# Patient Record
Sex: Female | Born: 1990 | Hispanic: Yes | Marital: Single | Smoking: Never smoker
Health system: Southern US, Community
[De-identification: ages and names within clinical notes are randomized; demographics above are authoritative.]

## PROBLEM LIST (undated history)

## (undated) ENCOUNTER — Inpatient Hospital Stay (HOSPITAL_COMMUNITY): Payer: Self-pay

## (undated) DIAGNOSIS — N189 Chronic kidney disease, unspecified: Secondary | ICD-10-CM

## (undated) HISTORY — PX: NO PAST SURGERIES: SHX2092

---
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
## 2010-05-19 LAB — CBC
HCT: 37.5 % (ref 36.0–49.0)
Hemoglobin: 12.5 g/dL (ref 12.0–16.0)
MCHC: 33.3 g/dL (ref 31.0–37.0)
MCV: 89.3 fL (ref 78.0–98.0)
MCV: 89.7 fL (ref 78.0–98.0)
Platelets: 207 10*3/uL (ref 150–400)
Platelets: 222 K/uL (ref 150–400)
RBC: 4.2 MIL/uL (ref 3.80–5.70)
RDW: 13.7 % (ref 11.4–15.5)
RDW: 13.8 % (ref 11.4–15.5)
WBC: 19.3 10*3/uL — ABNORMAL HIGH (ref 4.5–13.5)
WBC: 8.5 K/uL (ref 4.5–13.5)

## 2010-06-17 NOTE — Discharge Summary (Signed)
NAMEKAMERA, DUBAS NO.:  [id number]   MEDICAL RECORD NO.:  [id number]          PATIENT TYPE:  INP   LOCATION:  9302                          FACILITY:  [facility]   PHYSICIAN:  Lesly Dukes, M.D. DATE OF BIRTH:  Jul 02, 1990   DATE OF ADMISSION:  11/07/2007  DATE OF DISCHARGE:  11/10/2007                               DISCHARGE SUMMARY   REASON FOR ADMISSION:  Ms. [name] Warner is a 20 year old gravida 1, para  0 who presented with complaints of fever, chills, dysuria, and body  aches.  On initial evaluation, she was found to have urinalysis that  showed numerous white blood cells and was leukocyte esterase positive.  She was admitted to the ward for treatment of acute pyelonephritis.   HOSPITAL COURSE:  The patient's initial temperature on evaluation at  [facility] Admissions Unit was 99.6.  She was admitted to the hospital,  started on IV fluids, and IV Rocephin 1 g twice daily as well as Tylenol  and Percocet as needed for pain.  She remained afebrile during the  hospitalization and was discharged on hospital day #4 in good condition.  She will be given an outpatient prescription for Keflex 500 mg 1 pill 4  times a day for 11 more days.  She has also given a prescription for  prenatal vitamins.  She was instructed to follow up with her primary  care [name] Warner Dr. Sharen Hones in [facility] next week for  follow up care.   RECOMMENDATION:  That after her she completes 2-week course of  antibiotics that she will be placed on suppressive therapy with Macrobid  1 pill daily for the duration of her pregnancy to prevent any further  urinary tract infections or pyelonephritis.   CONDITION ON DISCHARGE:  Good.   LABS:  As described in hospital course.  There is a urine culture that  is pending at this time.   DISCHARGE INSTRUCTIONS:  The patient was instructed to maintain good  fluid intake and she has no restrictions as far as activity or diet is  concerned.  She was instructed to follow up with Dr. Sharen Hones next week  and she was instructed on her antibiotic medications as well.  All her  questions were answered.  The patient voiced understanding of these  instructions.      Odie Sera, DO      Lesly Dukes, M.D.  Electronically Signed    MC/MEDQ  D:  11/10/2007  T:  11/10/2007  Job:  [id number]

## 2010-11-03 LAB — URINALYSIS, ROUTINE W REFLEX MICROSCOPIC
Bilirubin Urine: NEGATIVE
Ketones, ur: NEGATIVE
Nitrite: NEGATIVE
Protein, ur: NEGATIVE
Urobilinogen, UA: 0.2
pH: 7

## 2010-11-03 LAB — URINE MICROSCOPIC-ADD ON

## 2010-11-04 LAB — COMPREHENSIVE METABOLIC PANEL
ALT: 27
AST: 35
Albumin: 2.6 — ABNORMAL LOW
CO2: 21
Chloride: 99
Creatinine, Ser: 0.53
Sodium: 131 — ABNORMAL LOW
Total Bilirubin: 0.4

## 2010-11-04 LAB — URINALYSIS, ROUTINE W REFLEX MICROSCOPIC
Bilirubin Urine: NEGATIVE
Glucose, UA: NEGATIVE
Protein, ur: NEGATIVE
Specific Gravity, Urine: 1.01
Urobilinogen, UA: 0.2

## 2010-11-04 LAB — URINE MICROSCOPIC-ADD ON

## 2010-11-04 LAB — URINE CULTURE: Colony Count: >=100000

## 2010-11-04 LAB — RAPID URINE DRUG SCREEN, HOSP PERFORMED
Barbiturates: NOT DETECTED
Opiates: NOT DETECTED

## 2010-11-04 LAB — CBC
Platelets: 235
RBC: 3.7 — ABNORMAL LOW
WBC: 9.6

## 2010-11-04 LAB — GLUCOSE, CAPILLARY: Glucose-Capillary: 116 — ABNORMAL HIGH

## 2012-06-13 ENCOUNTER — Inpatient Hospital Stay (HOSPITAL_COMMUNITY): Payer: Self-pay

## 2012-06-13 ENCOUNTER — Inpatient Hospital Stay (HOSPITAL_COMMUNITY)
Admission: AD | Admit: 2012-06-13 | Discharge: 2012-06-14 | Disposition: A | Discharge status: Home / Self Care | Payer: MEDICAID | Source: Ambulatory Visit | Attending: Obstetrics & Gynecology | Admitting: Obstetrics & Gynecology

## 2012-06-13 ENCOUNTER — Encounter (HOSPITAL_COMMUNITY): Payer: Self-pay | Admitting: *Deleted

## 2012-06-13 ENCOUNTER — Other Ambulatory Visit (HOSPITAL_COMMUNITY): Payer: Self-pay | Admitting: Nurse Practitioner

## 2012-06-13 DIAGNOSIS — R109 Unspecified abdominal pain: Secondary | ICD-10-CM | POA: Insufficient documentation

## 2012-06-13 DIAGNOSIS — O039 Complete or unspecified spontaneous abortion without complication: Secondary | ICD-10-CM

## 2012-06-13 DIAGNOSIS — O3680X0 Pregnancy with inconclusive fetal viability, not applicable or unspecified: Secondary | ICD-10-CM

## 2012-06-13 HISTORY — DX: Chronic kidney disease, unspecified: N18.9

## 2012-06-13 LAB — POCT PREGNANCY, URINE: Preg Test, Ur: POSITIVE — AB

## 2012-06-13 NOTE — MAU Note (Signed)
Pt at the office today, had a pap smear, had a small amt of bleeding then an hour later bleeding became heavier and cramping and lower back pain.

## 2012-06-13 NOTE — MAU Provider Note (Signed)
First Provider Initiated Contact with Patient 06/13/12 2228      Chief Complaint:  Abdominal Cramping bleeding  [name] Warner is  22 y.o. G2P1 at [redacted]w[redacted]d presents complaining of Abdominal Cramping Pt states [facility] a pap smear.  She began spotting afterwards, and the bleeding has since increased, along with abdominal cramping.  Her EDC is based loosely on a + UPT, as her LMP was last July.  No u/s yet this pregnancy (scheduled for this Friday).  Obstetrical/Gynecological History: Menstrual History: OB History   Grav Para Term Preterm Abortions TAB SAB Ect Mult Living   2 1        1        No LMP recorded. Patient is pregnant.     Past Medical History: Past Medical History  Diagnosis Date  . Chronic kidney disease     Past Surgical History: History reviewed. No pertinent past surgical history.  Family History: Family History  Problem Relation Age of Onset  . Asthma Mother   . Cancer Father   . Cancer Paternal Aunt   . Cancer Paternal Uncle     Social History: History  Substance Use Topics  . Smoking status: Never Smoker   . Smokeless tobacco: Never Used  . Alcohol Use: No    Allergies: No Known Allergies  Meds:  Prescriptions prior to admission  Medication Sig Dispense Refill  . Prenatal Vitamins (CALNA) TABS Take by mouth.          Review of Systems   Constitutional: Negative for fever and chills Eyes: Negative for visual disturbances Respiratory: Negative for shortness of breath, dyspnea Cardiovascular: Negative for chest pain or palpitations  Gastrointestinal: Negative for vomiting, diarrhea and constipation.  + for abdominal cramping Genitourinary: Negative for dysuria and urgency.  + for vaginal bleeding Musculoskeletal: Negative for back pain, joint pain, myalgias  Neurological: Negative for dizziness and headaches    Physical Exam  Blood pressure 116/73, pulse 80, temperature 97 F (36.1  C), temperature source Oral, resp. rate 16, height 5\' 3"  (1.6 m), weight 72.213 kg (159 lb 3.2 oz). GENERAL: Well-developed, well-nourished female in no acute distress.  LUNGS: Clear to auscultation bilaterally.  HEART: Regular rate and rhythm. ABDOMEN: Soft, nontender, nondistended, gravid.  VAGINAL:  SSE:  Moderate amount dark to bright red blood coming from cervical os.  Cervix closed per digital exam EXTREMITIES: Nontender, no edema, 2+ distal pulses.   Labs: Results for orders placed during the hospital encounter of 06/13/12 (from the past 24 hour(s))  POCT PREGNANCY, URINE   Collection Time    06/13/12  8:59 PM      Result Value Range   Preg Test, Ur POSITIVE (*) NEGATIVE  HCG, QUANTITATIVE, PREGNANCY   Collection Time    06/13/12  9:35 PM      Result Value Range   hCG, Beta Chain, Quant, S 4447 (*) <5 mIU/mL    Blood Type  B+ Imaging Studies:  No results found.  Assessment: [name] Warner is  22 y.o. G2P1 at [redacted]w[redacted]d presents with unknown GA, + bleeding from cervical os.  Plan: Ultrasound tonight.  Care turned over to [place] Either, [place]  CRESENZO-DISHMAN,FRANCES 5/12/201411:05 PM  2355 - Care assumed from Cathie Beams, CNM Patient waiting for [place].   US Ob Comp Less 14 Wks  06/14/2012  Bleeding, pain  Elongatedand low in positionNot visualized Embryo:  Not visualized.  Ovaries not visualized.  Intrauterine gestational sac measures 22  mm, is low in position, and without yolk sac or embryo. Concerning for nonviable pregnancy. Recommend clinical/ serial quantitative beta HCG correlation and short-term sonographic follow-up.   Original Report Authenticated By: Jearld Lesch, M.D.    US Ob Transvaginal  06/14/2012  Bleeding, pain  Elongatedand low in positionNot visualized Embryo:  Not visualized.  Ovaries not visualized.  Intrauterine gestational sac measures 22 mm, is low in position, and without yolk sac or embryo. Concerning for nonviable pregnancy. Recommend  clinical/ serial quantitative beta HCG correlation and short-term sonographic follow-up.   Original Report Authenticated By: Jearld Lesch, M.D.    MDM Patient returned from [place] in severe pain. She feels as though bleeding has increased. I was able to remove a moderate amount of blood with 2 small clots from the vagina. This did not appear to contain much tissue. 1 mg IM Dilaudid given. Patient reports improvement in pain when reassessed about 20 minutes later. The patient then developed some nausea. 4 mg ODT Zofran given.  Discussed at length with patient options of expectant management vs. Cytotec with interpreter present. She would prefer to use cytotec today. Cytotec placed by RN in MAU.   A: SAB  P: Discharge home Rx for Percocet, Phenergan and Ibuprofen given Bleeding precautions discussed Patient will follow-up in GYN clinic in ~ 2 weeks Patient may return to MAU as needed or if her condition were to change or worsen  Freddi Starr, PA-C 06/14/2012 12:15 AM

## 2012-06-14 ENCOUNTER — Encounter (HOSPITAL_COMMUNITY): Payer: Self-pay

## 2012-06-14 ENCOUNTER — Inpatient Hospital Stay (HOSPITAL_COMMUNITY): Payer: Self-pay

## 2012-06-14 ENCOUNTER — Encounter (HOSPITAL_COMMUNITY): Payer: Self-pay | Admitting: *Deleted

## 2012-06-14 ENCOUNTER — Inpatient Hospital Stay (HOSPITAL_COMMUNITY)
Admission: AD | Admit: 2012-06-14 | Discharge: 2012-06-14 | Disposition: A | Discharge status: Home / Self Care | Payer: MEDICAID | Source: Ambulatory Visit | Attending: Obstetrics & Gynecology | Admitting: Obstetrics & Gynecology

## 2012-06-14 DIAGNOSIS — O034 Incomplete spontaneous abortion without complication: Secondary | ICD-10-CM

## 2012-06-14 DIAGNOSIS — O039 Complete or unspecified spontaneous abortion without complication: Secondary | ICD-10-CM | POA: Insufficient documentation

## 2012-06-14 LAB — CBC
HCT: 34.9 % — ABNORMAL LOW (ref 36.0–46.0)
Hemoglobin: 12.7 g/dL (ref 12.0–15.0)
Platelets: 244 10*3/uL (ref 150–400)
RBC: 4.84 MIL/uL (ref 3.87–5.11)
RBC: 4.19 MIL/uL (ref 3.87–5.11)
RDW: 12 % (ref 11.5–15.5)
WBC: 10.1 10*3/uL (ref 4.0–10.5)
WBC: 14 10*3/uL — ABNORMAL HIGH (ref 4.0–10.5)

## 2012-06-14 LAB — HCG, QUANTITATIVE, PREGNANCY: hCG, Beta Chain, Quant, S: 2694 m[IU]/mL — ABNORMAL HIGH (ref <5)

## 2012-06-14 MED ORDER — ONDANSETRON HCL 4 MG/2ML IJ SOLN
4.0000 mg | Freq: Once | INTRAMUSCULAR | Status: AC
  Administered 2012-06-14: 4 mg via INTRAVENOUS
  Filled 2012-06-14: qty 2

## 2012-06-14 MED ORDER — HYDROMORPHONE HCL PF 1 MG/ML IJ SOLN
1.0000 mg | Freq: Once | INTRAMUSCULAR | Status: AC
  Administered 2012-06-14: 1 mg via INTRAMUSCULAR
  Filled 2012-06-14: qty 1

## 2012-06-14 MED ORDER — KETOROLAC TROMETHAMINE 30 MG/ML IJ SOLN
30.0000 mg | Freq: Once | INTRAMUSCULAR | Status: AC
  Administered 2012-06-14: 30 mg via INTRAVENOUS
  Filled 2012-06-14: qty 1

## 2012-06-14 MED ORDER — ONDANSETRON 8 MG PO TBDP
8.0000 mg | ORAL_TABLET | Freq: Once | ORAL | Status: AC
  Administered 2012-06-14: 8 mg via ORAL
  Filled 2012-06-14: qty 1

## 2012-06-14 MED ORDER — MISOPROSTOL 200 MCG PO TABS
800.0000 ug | ORAL_TABLET | Freq: Once | ORAL | Status: AC
  Administered 2012-06-14: 800 ug via VAGINAL
  Filled 2012-06-14: qty 4

## 2012-06-14 MED ORDER — IBUPROFEN 600 MG PO TABS: 600.0000 mg | ORAL_TABLET | Freq: Four times a day (QID) | ORAL | Status: DC | PRN

## 2012-06-14 MED ORDER — LACTATED RINGERS IV BOLUS (SEPSIS)
1000.0000 mL | Freq: Once | INTRAVENOUS | Status: AC
  Administered 2012-06-14: 1000 mL via INTRAVENOUS

## 2012-06-14 MED ORDER — DEXTROSE 5 % IN LACTATED RINGERS IV BOLUS
1000.0000 mL | Freq: Once | INTRAVENOUS | Status: AC
  Administered 2012-06-14: 1000 mL via INTRAVENOUS

## 2012-06-14 MED ORDER — PROMETHAZINE HCL 25 MG PO TABS: 25.0000 mg | ORAL_TABLET | Freq: Four times a day (QID) | ORAL | Status: DC | PRN

## 2012-06-14 MED ORDER — OXYCODONE-ACETAMINOPHEN 5-325 MG PO TABS: 2.0000 | ORAL_TABLET | ORAL | Status: DC | PRN

## 2012-06-14 NOTE — MAU Note (Signed)
Patient is brought straight into room 2 with heavy vaginal bleeding. The medium sanitary pad that she placed at 8am was completely saturated. New blue pad given. Patient c/o mild cramping.

## 2012-06-14 NOTE — MAU Provider Note (Signed)
History     CSN: [id number]  Arrival date and time: 06/14/12 0910   None     Chief Complaint  Patient presents with  . Vaginal Bleeding  . Abdominal Cramping   HPI  Pt presents with heavy bleeding in pregnancy after being seen on 5/12 with threatened ab/anembryonic pregnancy- Her Beta HCG was 4447 at that time and ultrasound showed IUGS 22mm low without YS or embryo. Pt states that at 8 am her bleeding became very heavy saturating a blue pad and having cramping.  Pt passed some large clots.  Pt has not eaten anything today- has had some water early this morning and has not taken anything for the pain. Pt's Blood Type is  Pos.   Past Medical History  Diagnosis Date  . Chronic kidney disease     No past surgical history on file.  Family History  Problem Relation Age of Onset  . Asthma Mother   . Cancer Father   . Cancer Paternal Aunt   . Cancer Paternal Uncle     History  Substance Use Topics  . Smoking status: Never Smoker   . Smokeless tobacco: Never Used  . Alcohol Use: No    Allergies: No Known Allergies  Prescriptions prior to admission  Medication Sig Dispense Refill  . Prenatal Vit-Fe Fumarate-FA (PRENATAL MULTIVITAMIN) TABS Take 1 tablet by mouth daily at 12 noon.        ROS Physical Exam   Blood pressure 90/60, pulse 94, temperature 97.5 F (36.4 C), temperature source Oral, resp. rate 20, SpO2 100.00%.  Physical Exam  Nursing note and vitals reviewed. Constitutional: She is oriented to person, place, and time. She appears well-developed and well-nourished.  Uncomfortable appearing  HENT:  Head: Normocephalic.  Eyes: Pupils are equal, round, and reactive to light.  Neck: Normal range of motion. Neck supple.  Cardiovascular: Normal rate.   Respiratory: Effort normal.  GI: Soft. She exhibits no distension. There is no tenderness.  Genitourinary:  Speculum exam revealed large amount of bright red blood- tissue and clots removed with ring forceps.   Bleeding under control after clots/tissue removed.  Pt's cramping diminished  Musculoskeletal: Normal range of motion.  Neurological: She is alert and oriented to person, place, and time.  Skin: Skin is warm and dry.  Psychiatric: She has a normal mood and affect.    MAU Course  Procedures IV D5LR infused; toradol 30mg  IV and Zofran 4mg  IVP given Pt went to ultrasound and while in ultrasound went to the bathroom and had increased bleeding, knees felt weak- when pt returned to room, pt felt dizzy with room spinning, decrease in BP- O2 by mask started; IVF bolus given Speculum exam revealed small amount of clot/tissue- with mod bleeding; after clot/tissue removed, bleeding arrested- pt's pain level improved Dr. Marice Potter called to see pt for evaluation-will continue IVF and observe- pt able to d/c when feels comfortable. (Eda present for interpretation) US Ob Comp Less 14 Wks  06/14/2012  Bleeding, pain  Elongatedand low in positionNot visualized Embryo:  Not visualized.  Ovaries not visualized.  Intrauterine gestational sac measures 22 mm, is low in position, and without yolk sac or embryo. Concerning for nonviable pregnancy. Recommend clinical/ serial quantitative beta HCG correlation and short-term sonographic follow-up.   Original Report Authenticated By: Jearld Lesch, M.D.    US Ob Transvaginal  06/14/2012  Bleeding, pain  Elongatedand low in positionNot visualized Embryo:  Not visualized.  Ovaries not visualized.  Intrauterine gestational sac measures  22 mm, is low in position, and without yolk sac or embryo. Concerning for nonviable pregnancy. Recommend clinical/ serial quantitative beta HCG correlation and short-term sonographic follow-up.   Original Report Authenticated By: Jearld Lesch, M.D.    US Transvaginal Non-ob  06/14/2012  *RADIOLOGY REPORT*  Clinical Data: Recent spontaneous abortion.  Persistent heavy vaginal bleeding.  Value for retained products of conception.   TRANSABDOMINAL AND TRANSVAGINAL ULTRASOUND OF PELVIS  Technique:  Both transabdominal and transvaginal ultrasound examinations of the pelvis were performed.  Transabdominal technique was performed for global imaging of the pelvis including uterus, ovaries, adnexal regions, and pelvic cul-de-sac.  It was necessary to proceed with endovaginal exam following the transabdominal exam to visualize the endometrial cavity and ovaries.  Comparison:  06/14/2012  Findings: Uterus:  7.8 x 4.2 x 5.4 cm.  Retroverted.  Previously seen intrauterine gestational sac is no longer visualized, consistent with recent spontaneous abortion. No fibroids identified.  Endometrium: Thickened and heterogeneous, measuring approximately 12 mm in thickness.  No focal mass seen.  Right ovary: 2.4 x 1.4 x 1.4 cm.  Normal appearance.  No adnexal mass identified.  Left ovary: 2.0 x 2.0 x 1.9 cm.  Normal appearance.  No adnexal mass identified.  Other Findings:  No free fluid  IMPRESSION:  1.  Spontaneous abortion since prior exam. 2.  Heterogeneous thickening of endometrium measuring 12 mm, however, no focal mass visualized.   Original Report Authenticated By: Myles Rosenthal, M.D.    US Pelvis Complete  06/14/2012  *RADIOLOGY REPORT*  Clinical Data: Recent spontaneous abortion.  Persistent heavy vaginal bleeding.  Value for retained products of conception.  TRANSABDOMINAL AND TRANSVAGINAL ULTRASOUND OF PELVIS  Technique:  Both transabdominal and transvaginal ultrasound examinations of the pelvis were performed.  Transabdominal technique was performed for global imaging of the pelvis including uterus, ovaries, adnexal regions, and pelvic cul-de-sac.  It was necessary to proceed with endovaginal exam following the transabdominal exam to visualize the endometrial cavity and ovaries.  Comparison:  06/14/2012  Findings: Uterus:  7.8 x 4.2 x 5.4 cm.  Retroverted.  Previously seen intrauterine gestational sac is no longer visualized, consistent with recent  spontaneous abortion. No fibroids identified.  Endometrium: Thickened and heterogeneous, measuring approximately 12 mm in thickness.  No focal mass seen.  Right ovary: 2.4 x 1.4 x 1.4 cm.  Normal appearance.  No adnexal mass identified.  Left ovary: 2.0 x 2.0 x 1.9 cm.  Normal appearance.  No adnexal mass identified.  Other Findings:  No free fluid  IMPRESSION:  1.  Spontaneous abortion since prior exam. 2.  Heterogeneous thickening of endometrium measuring 12 mm, however, no focal mass visualized.   Original Report Authenticated By: Myles Rosenthal, M.D.   pt feeling better after 1 1/2 liters of IVF- able eat and sit up Minimal blood on pad; no cramping Assessment and Plan  SAB -Discharge home F/u in GYN clinic in 1 week for hcg and 2 week appt Ibuprofen prn pain  [name] Warner 06/14/2012, 10:57 AM

## 2012-06-16 NOTE — MAU Provider Note (Signed)
Attestation of Attending Supervision of Advanced Practitioner (CNM/NP): Evaluation and management procedures were performed by the Advanced Practitioner under my supervision and collaboration. I have reviewed the Advanced Practitioner's note and chart, and I agree with the management and plan.  Jocelyne Reinertsen H. 3:03 PM   

## 2012-06-17 ENCOUNTER — Ambulatory Visit (HOSPITAL_COMMUNITY): Payer: Self-pay

## 2012-06-21 ENCOUNTER — Other Ambulatory Visit: Payer: Self-pay

## 2012-12-15 ENCOUNTER — Inpatient Hospital Stay (HOSPITAL_COMMUNITY): Payer: Medicaid Other

## 2012-12-15 ENCOUNTER — Encounter (HOSPITAL_COMMUNITY): Payer: Self-pay

## 2012-12-15 ENCOUNTER — Inpatient Hospital Stay (HOSPITAL_COMMUNITY)
Admission: AD | Admit: 2012-12-15 | Discharge: 2012-12-15 | Disposition: A | Discharge status: Home / Self Care | Payer: Self-pay | Source: Ambulatory Visit | Attending: Obstetrics & Gynecology | Admitting: Obstetrics & Gynecology

## 2012-12-15 DIAGNOSIS — O26899 Other specified pregnancy related conditions, unspecified trimester: Secondary | ICD-10-CM

## 2012-12-15 DIAGNOSIS — O3680X0 Pregnancy with inconclusive fetal viability, not applicable or unspecified: Secondary | ICD-10-CM

## 2012-12-15 DIAGNOSIS — O99891 Other specified diseases and conditions complicating pregnancy: Secondary | ICD-10-CM | POA: Insufficient documentation

## 2012-12-15 DIAGNOSIS — O3680X1 Pregnancy with inconclusive fetal viability, fetus 1: Secondary | ICD-10-CM

## 2012-12-15 DIAGNOSIS — R109 Unspecified abdominal pain: Secondary | ICD-10-CM | POA: Insufficient documentation

## 2012-12-15 LAB — URINALYSIS, ROUTINE W REFLEX MICROSCOPIC
Ketones, ur: NEGATIVE mg/dL
Leukocytes, UA: NEGATIVE
Nitrite: NEGATIVE
Specific Gravity, Urine: 1.025 (ref 1.005–1.030)
pH: 6 (ref 5.0–8.0)

## 2012-12-15 LAB — HCG, QUANTITATIVE, PREGNANCY: hCG, Beta Chain, Quant, S: 173 m[IU]/mL — ABNORMAL HIGH (ref <5)

## 2012-12-15 LAB — CBC
MCH: 29.5 pg (ref 26.0–34.0)
Platelets: 266 10*3/uL (ref 150–400)
RBC: 4.75 MIL/uL (ref 3.87–5.11)

## 2012-12-15 LAB — POCT PREGNANCY, URINE: Preg Test, Ur: POSITIVE — AB

## 2012-12-15 LAB — WET PREP, GENITAL: Clue Cells Wet Prep HPF POC: NONE SEEN

## 2012-12-15 MED ORDER — FLUCONAZOLE 150 MG PO TABS
150.0000 mg | ORAL_TABLET | Freq: Once | ORAL | Status: AC
  Administered 2012-12-15: 150 mg via ORAL
  Filled 2012-12-15: qty 1

## 2012-12-15 NOTE — MAU Note (Signed)
Patient states she has had a positive home pregnancy test. States she has been having some mild abdominal pain. Has a red vaginal discharge with no odor, itching.

## 2012-12-15 NOTE — MAU Provider Note (Signed)
History     CSN: [id number]  Arrival date and time: 12/15/12 1038   None     No chief complaint on file.  HPI Pt is 22 yo Hispanic G3P011 [redacted]w[redacted]d pregnant with positive home pregnancy test.  Pt had SAB 6 months ago.  Pt has mild abdominal pain Pt has used Vagisil vaginal cream with a little relief of sx; no bleeding now,. RN note: Patient states she has had a positive home pregnancy test. States she has been having some mild abdominal pain. Has a red vaginal discharge with no odor, itching.    Past Medical History  Diagnosis Date  . Chronic kidney disease     No past surgical history on file.  Family History  Problem Relation Age of Onset  . Asthma Mother   . Cancer Father   . Cancer Paternal Aunt   . Cancer Paternal Uncle     History  Substance Use Topics  . Smoking status: Never Smoker   . Smokeless tobacco: Never Used  . Alcohol Use: No    Allergies: No Known Allergies  Prescriptions prior to admission  Medication Sig Dispense Refill  . Prenatal Vit-Fe Fumarate-FA (PRENATAL MULTIVITAMIN) TABS Take 1 tablet by mouth daily at 12 noon.        ROS Physical Exam   Blood pressure 126/68, pulse 92, temperature 98.8 F (37.1 C), temperature source Oral, resp. rate 16, height 5' 3.5" (1.613 m), weight 69.763 kg (153 lb 12.8 oz), last menstrual period 11/12/2012, SpO2 100.00%.  Physical Exam  Nursing note and vitals reviewed. Constitutional: She is oriented to person, place, and time. She appears well-developed and well-nourished. No distress.  HENT:  Head: Normocephalic.  Eyes: Pupils are equal, round, and reactive to light.  Neck: Normal range of motion.  Cardiovascular: Normal rate.   Respiratory: Effort normal.  GI: Soft. She exhibits no distension. There is no tenderness. There is no rebound and no guarding.  Genitourinary:  Pt has white vaginal cream in vault obscuring vaginal discharge; cervix parous; uterus ~6week size, NT; adnexa without palpable  enlargement, mild tenderness left adnexa  Musculoskeletal: Normal range of motion.  Neurological: She is alert and oriented to person, place, and time.  Skin: Skin is warm and dry.  Psychiatric: She has a normal mood and affect.    MAU Course  Procedures Results for orders placed during the hospital encounter of 12/15/12 (from the past 24 hour(s))  URINALYSIS, ROUTINE W REFLEX MICROSCOPIC     Status: None   Collection Time    12/15/12 11:45 AM      Result Value Range   Color, Urine YELLOW  YELLOW   APPearance CLEAR  CLEAR   Specific Gravity, Urine 1.025  1.005 - 1.030   pH 6.0  5.0 - 8.0   Glucose, UA NEGATIVE  NEGATIVE mg/dL   Hgb urine dipstick NEGATIVE  NEGATIVE   Bilirubin Urine NEGATIVE  NEGATIVE   Ketones, ur NEGATIVE  NEGATIVE mg/dL   Protein, ur NEGATIVE  NEGATIVE mg/dL   Urobilinogen, UA 0.2  0.0 - 1.0 mg/dL   Nitrite NEGATIVE  NEGATIVE   Leukocytes, UA NEGATIVE  NEGATIVE  POCT PREGNANCY, URINE     Status: Abnormal   Collection Time    12/15/12 11:56 AM      Result Value Range   Preg Test, Ur POSITIVE (*) NEGATIVE  CBC     Status: Abnormal   Collection Time    12/15/12 12:15 PM      Result  Value Range   WBC 9.6  4.0 - 10.5 K/uL   RBC 4.75  3.87 - 5.11 MIL/uL   Hemoglobin 14.0  12.0 - 15.0 g/dL   HCT 16.1  09.6 - 04.5 %   MCV 81.3  78.0 - 100.0 fL   MCH 29.5  26.0 - 34.0 pg   MCHC 36.3 (*) 30.0 - 36.0 g/dL   RDW 40.9  81.1 - 91.4 %   Platelets 266  150 - 400 K/uL  HCG, QUANTITATIVE, PREGNANCY     Status: Abnormal   Collection Time    12/15/12 12:15 PM      Result Value Range   hCG, Beta Chain, Quant, S 173 (*) <5 mIU/mL  WET PREP, GENITAL     Status: Abnormal   Collection Time    12/15/12  1:43 PM      Result Value Range   Yeast Wet Prep HPF POC NONE SEEN  NONE SEEN   Trich, Wet Prep NONE SEEN  NONE SEEN   Clue Cells Wet Prep HPF POC NONE SEEN  NONE SEEN   WBC, Wet Prep HPF POC FEW (*) NONE SEEN   US Ob Comp Less 14 Wks  12/15/2012   CLINICAL DATA:   Bleeding, spotting.  EXAM: TRANSVAGINAL OB ULTRASOUND; OBSTETRIC <14 WK ULTRASOUND  TECHNIQUE: Transvaginal ultrasound was performed for complete evaluation of the gestation as well as the maternal uterus, adnexal regions, and pelvic cul-de-sac.  COMPARISON:  Pelvic ultrasound 06/14/2012.  FINDINGS: Intrauterine gestational sac: Possible tiny intrauterine gestational sac.  Yolk sac:  Not visualized  Embryo:  Not visualized  Cardiac Activity: Not visualized  Maternal uterus/adnexae: Probable left corpus luteal cyst measuring 2.5 x 1.8 cm. Trace free fluid. Retroverted uterus.  IMPRESSION: Possible tiny intrauterine gestational sac. In the setting of positive pregnancy test and no definite intrauterine pregnancy, this reflects a pregnancy of unknown location. Differential considerations include early normal IUP, abnormal IUP, or nonvisualized ectopic pregnancy. Differentiation is achieved with serial beta HCG supplemented by repeat sonography as clinically warranted.   Electronically Signed   By: Annia Belt M.D.   On: 12/15/2012 14:30   US Ob Transvaginal  12/15/2012   CLINICAL DATA:  Bleeding, spotting.  EXAM: TRANSVAGINAL OB ULTRASOUND; OBSTETRIC <14 WK ULTRASOUND  TECHNIQUE: Transvaginal ultrasound was performed for complete evaluation of the gestation as well as the maternal uterus, adnexal regions, and pelvic cul-de-sac.  COMPARISON:  Pelvic ultrasound 06/14/2012.  FINDINGS: Intrauterine gestational sac: Possible tiny intrauterine gestational sac.  Yolk sac:  Not visualized  Embryo:  Not visualized  Cardiac Activity: Not visualized  Maternal uterus/adnexae: Probable left corpus luteal cyst measuring 2.5 x 1.8 cm. Trace free fluid. Retroverted uterus.  IMPRESSION: Possible tiny intrauterine gestational sac. In the setting of positive pregnancy test and no definite intrauterine pregnancy, this reflects a pregnancy of unknown location. Differential considerations include early normal IUP, abnormal IUP, or  nonvisualized ectopic pregnancy. Differentiation is achieved with serial beta HCG supplemented by repeat sonography as clinically warranted.   Electronically Signed   By: Annia Belt M.D.   On: 12/15/2012 14:30     Assessment and Plan  Abdominal pain in pregnancy F/u HCG 48 hours; return sooner if increase in pain  Avyn Coate 12/15/2012, 12:07 PM

## 2012-12-17 ENCOUNTER — Inpatient Hospital Stay (HOSPITAL_COMMUNITY)
Admission: AD | Admit: 2012-12-17 | Discharge: 2012-12-17 | Disposition: A | Discharge status: Home / Self Care | Payer: Self-pay | Source: Ambulatory Visit | Attending: Obstetrics and Gynecology | Admitting: Obstetrics and Gynecology

## 2012-12-17 DIAGNOSIS — O26839 Pregnancy related renal disease, unspecified trimester: Secondary | ICD-10-CM | POA: Insufficient documentation

## 2012-12-17 DIAGNOSIS — R109 Unspecified abdominal pain: Secondary | ICD-10-CM | POA: Insufficient documentation

## 2012-12-17 DIAGNOSIS — O26899 Other specified pregnancy related conditions, unspecified trimester: Secondary | ICD-10-CM

## 2012-12-17 DIAGNOSIS — O99891 Other specified diseases and conditions complicating pregnancy: Secondary | ICD-10-CM | POA: Insufficient documentation

## 2012-12-17 DIAGNOSIS — O9989 Other specified diseases and conditions complicating pregnancy, childbirth and the puerperium: Secondary | ICD-10-CM

## 2012-12-17 NOTE — MAU Provider Note (Signed)
[name] Warner is a 22 y.o. G3P0011 at [redacted]w[redacted]d here for follow up quant HCG. Originally seen on 12/15/12 with low abd pain. Vaginal bleeding: none. Abdominal pain: none.   Prior HCGs: 173 on 12/15/12  Prior U/S: possible small IUGS, no adnexal mass  Past Medical History  Diagnosis Date  . Chronic kidney disease     No prescriptions prior to admission    No Known Allergies  Review of Systems - negative  Objective BP 119/67  Pulse 81  Temp(Src) 98.2 F (36.8 C) (Oral)  Ht 5' 3.5" (1.613 m)  Wt 153 lb (69.4 kg)  BMI 26.67 kg/m2  LMP 11/12/2012  General: Alert, oriented, no acute distress  Results for orders placed during the hospital encounter of 12/17/12 (from the past 24 hour(s))  HCG, QUANTITATIVE, PREGNANCY     Status: Abnormal   Collection Time    12/17/12 10:35 AM      Result Value Range   hCG, Beta Chain, Quant, S 421 (*) <5 mIU/mL    Assessment  1. Abdominal pain in pregnancy, antepartum     Plan Repeat u/s in 1 week Rev'd precautions, return with bleeding/pain     Medication List    Notice   You have not been prescribed any medications.      Follow-up Information   Follow up with [facility] [place] [facility]TR[facility] In 1 week.   Specialty:  Radiology   Contact information:   [address] [id number] [place] [address] [phone]      Follow up with [facility] [place] MATERNITY ADMISS[facility] In 1 week. (after ultrasound)    Contact information:   [address] [id number] [place] [address] [phone]      [facility] 11:21 AM 12/17/12  \

## 2012-12-20 NOTE — MAU Provider Note (Signed)
Attestation of Attending Supervision of Advanced Practitioner (CNM/NP): Evaluation and management procedures were performed by the Advanced Practitioner under my supervision and collaboration.  I have reviewed the Advanced Practitioner's note and chart, and I agree with the management and plan.  Roseland Braun 12/20/2012 9:28 AM

## 2012-12-23 ENCOUNTER — Ambulatory Visit (HOSPITAL_COMMUNITY)
Admission: RE | Admit: 2012-12-23 | Discharge: 2012-12-23 | Disposition: A | Discharge status: Home / Self Care | Payer: Medicaid Other | Source: Ambulatory Visit | Attending: Advanced Practice Midwife | Admitting: Advanced Practice Midwife

## 2012-12-23 ENCOUNTER — Inpatient Hospital Stay (HOSPITAL_COMMUNITY)
Admission: AD | Admit: 2012-12-23 | Discharge: 2012-12-23 | Disposition: A | Discharge status: Home / Self Care | Payer: Self-pay | Source: Ambulatory Visit | Attending: Obstetrics & Gynecology | Admitting: Obstetrics & Gynecology

## 2012-12-23 DIAGNOSIS — Z3201 Encounter for pregnancy test, result positive: Secondary | ICD-10-CM

## 2012-12-23 DIAGNOSIS — O26899 Other specified pregnancy related conditions, unspecified trimester: Secondary | ICD-10-CM

## 2012-12-23 DIAGNOSIS — O99891 Other specified diseases and conditions complicating pregnancy: Secondary | ICD-10-CM | POA: Insufficient documentation

## 2012-12-23 DIAGNOSIS — R109 Unspecified abdominal pain: Secondary | ICD-10-CM | POA: Insufficient documentation

## 2012-12-23 DIAGNOSIS — Z3689 Encounter for other specified antenatal screening: Secondary | ICD-10-CM | POA: Insufficient documentation

## 2012-12-23 NOTE — MAU Provider Note (Signed)
S: 22 y.o. [id number] @[redacted]w[redacted]d  presents to MAU for repeat U/S 1 week after appropriate rise in quant hcg.  She denies pain or bleeding today.    O: BP 108/62  Pulse 76  Temp(Src) 99.1 F (37.3 C) (Oral)  Resp 16  SpO2 100%  LMP 11/12/2012  US Ob Transvaginal  12/23/2012   CLINICAL DATA:  Followup early pregnancy  EXAM: TRANSVAGINAL OB ULTRASOUND  TECHNIQUE: Transvaginal ultrasound was performed for complete evaluation of the gestation as well as the maternal uterus, adnexal regions, and pelvic cul-de-sac.  COMPARISON:  12/15/2012  FINDINGS: Intrauterine gestational sac: Visualized/normal in shape.  Yolk sac:  Not visualized  Embryo:  Not visualized  Cardiac Activity: Not visualized  MSD: 6  mm   5 w   1  d            [place] EDC: 08/24/13  Maternal uterus/adnexae: The ovaries are normal. Small pelvic free fluid.  IMPRESSION: Intrauterine gestational sac but no yolk sac, fetal pole or cardiac activity yet identified. To document appropriate pregnancy progression and dating, followup ultrasound is recommended in 10-14 days.   Electronically Signed   By: Christiana Pellant M.D.   On: 12/23/2012 09:32   A: 1. Positive blood pregnancy test    P: D/C home with ectopic precautions Repeat U/S in 1 week--scheduled and given to pt Pt has appt to start [facility] this week Return to MAU as needed  Sharen Counter Certified Nurse-Midwife

## 2012-12-23 NOTE — MAU Provider Note (Signed)
Attestation of Attending Supervision of Advanced Practitioner (CNM/NP): Evaluation and management procedures were performed by the Advanced Practitioner under my supervision and collaboration.  I have reviewed the Advanced Practitioner's note and chart, and I agree with the management and plan.  HARRAWAY-SMITH, Ritta Hammes 11:13 AM     

## 2012-12-23 NOTE — MAU Note (Signed)
Patient to MAU after ultrasound. Patient denies pain or bleeding.  

## 2013-01-02 ENCOUNTER — Ambulatory Visit (HOSPITAL_COMMUNITY)
Admit: 2013-01-02 | Discharge: 2013-01-02 | Disposition: A | Discharge status: Home / Self Care | Payer: Medicaid Other | Attending: Obstetrics & Gynecology | Admitting: Obstetrics & Gynecology

## 2013-01-02 DIAGNOSIS — O3680X0 Pregnancy with inconclusive fetal viability, not applicable or unspecified: Secondary | ICD-10-CM | POA: Insufficient documentation

## 2013-01-02 DIAGNOSIS — Z3201 Encounter for pregnancy test, result positive: Secondary | ICD-10-CM

## 2013-01-12 LAB — OB RESULTS CONSOLE HIV ANTIBODY (ROUTINE TESTING): HIV: NONREACTIVE

## 2013-01-12 LAB — OB RESULTS CONSOLE ANTIBODY SCREEN: Antibody Screen: NEGATIVE

## 2013-01-12 LAB — OB RESULTS CONSOLE ABO/RH: "RH Type ": POSITIVE

## 2013-01-12 LAB — OB RESULTS CONSOLE RUBELLA ANTIBODY, IGM: Rubella: IMMUNE

## 2013-01-12 LAB — OB RESULTS CONSOLE RPR: RPR: NONREACTIVE

## 2013-02-02 NOTE — L&D Delivery Note (Signed)
Delivery Note At 1:16 AM a viable female was delivered via Vaginal, Spontaneous Delivery (Presentation: Right Occiput Anterior).  APGAR: 9, 9; weight pending .   Placenta status: Intact, Spontaneous.  Cord: 3 vessels with the following complications: None.   Anesthesia: Epidural  Episiotomy: None Lacerations: None Suture Repair: N/A Est. Blood Loss (mL):   Mom to postpartum.  Baby to Couplet care / Skin to Skin.  Called to delivery. Mother pushed over 5 min. Infant delivered to maternal abdomen. Delayed cord clamping performed. Cord clamped and cut. Active management of 3rd stage with traction and Pitocin. Placenta delivered intact with 3v cord. No tears. EBL 400cc. Counts correct. Hemostatic.   Maximum Reiland G 08/17/2013, 1:38 AM

## 2013-02-02 NOTE — L&D Delivery Note (Signed)
I have seen and examined this patient and I agree with the above. Hasten Sweitzer CNM 8:56 AM 08/17/2013    

## 2013-08-03 LAB — OB RESULTS CONSOLE GC/CHLAMYDIA
Chlamydia: NEGATIVE
Gonorrhea: NEGATIVE

## 2013-08-03 LAB — OB RESULTS CONSOLE GBS: STREP GROUP B AG: NEGATIVE

## 2013-08-16 ENCOUNTER — Encounter (HOSPITAL_COMMUNITY): Payer: Medicaid Other | Admitting: Anesthesiology

## 2013-08-16 ENCOUNTER — Inpatient Hospital Stay (HOSPITAL_COMMUNITY): Payer: Medicaid Other | Admitting: Anesthesiology

## 2013-08-16 ENCOUNTER — Encounter (HOSPITAL_COMMUNITY): Payer: Self-pay | Admitting: *Deleted

## 2013-08-16 ENCOUNTER — Inpatient Hospital Stay (HOSPITAL_COMMUNITY)
Admission: AD | Admit: 2013-08-16 | Discharge: 2013-08-16 | Disposition: A | Discharge status: Home / Self Care | Payer: Medicaid Other | Source: Ambulatory Visit | Attending: Obstetrics and Gynecology | Admitting: Obstetrics and Gynecology

## 2013-08-16 ENCOUNTER — Inpatient Hospital Stay (HOSPITAL_COMMUNITY)
Admission: AD | Admit: 2013-08-16 | Discharge: 2013-08-18 | DRG: 775 | Disposition: A | Discharge status: Home / Self Care | Payer: Medicaid Other | Source: Ambulatory Visit | Attending: Obstetrics & Gynecology | Admitting: Obstetrics & Gynecology

## 2013-08-16 DIAGNOSIS — K219 Gastro-esophageal reflux disease without esophagitis: Secondary | ICD-10-CM | POA: Diagnosis present

## 2013-08-16 DIAGNOSIS — E669 Obesity, unspecified: Secondary | ICD-10-CM | POA: Diagnosis present

## 2013-08-16 DIAGNOSIS — D649 Anemia, unspecified: Secondary | ICD-10-CM | POA: Diagnosis present

## 2013-08-16 DIAGNOSIS — O4690 Antepartum hemorrhage, unspecified, unspecified trimester: Secondary | ICD-10-CM | POA: Insufficient documentation

## 2013-08-16 DIAGNOSIS — O99214 Obesity complicating childbirth: Secondary | ICD-10-CM

## 2013-08-16 DIAGNOSIS — Z3490 Encounter for supervision of normal pregnancy, unspecified, unspecified trimester: Secondary | ICD-10-CM

## 2013-08-16 DIAGNOSIS — O9902 Anemia complicating childbirth: Principal | ICD-10-CM | POA: Diagnosis present

## 2013-08-16 DIAGNOSIS — Z6833 Body mass index (BMI) 33.0-33.9, adult: Secondary | ICD-10-CM | POA: Diagnosis not present

## 2013-08-16 DIAGNOSIS — O479 False labor, unspecified: Secondary | ICD-10-CM | POA: Diagnosis present

## 2013-08-16 DIAGNOSIS — R109 Unspecified abdominal pain: Secondary | ICD-10-CM | POA: Diagnosis not present

## 2013-08-16 DIAGNOSIS — Z825 Family history of asthma and other chronic lower respiratory diseases: Secondary | ICD-10-CM

## 2013-08-16 LAB — TYPE AND SCREEN
ABO/RH(D): B POS
Antibody Screen: NEGATIVE

## 2013-08-16 LAB — CBC
HCT: 32.7 % — ABNORMAL LOW (ref 36.0–46.0)
Hemoglobin: 11.7 g/dL — ABNORMAL LOW (ref 12.0–15.0)
MCH: 30.9 pg (ref 26.0–34.0)
MCHC: 35.8 g/dL (ref 30.0–36.0)
MCV: 86.3 fL (ref 78.0–100.0)
PLATELETS: 221 10*3/uL (ref 150–400)
RBC: 3.79 MIL/uL — AB (ref 3.87–5.11)
RDW: 13.2 % (ref 11.5–15.5)
WBC: 17.7 10*3/uL — ABNORMAL HIGH (ref 4.0–10.5)

## 2013-08-16 MED ORDER — LACTATED RINGERS IV SOLN: 500.0000 mL | INTRAVENOUS | Status: DC | PRN

## 2013-08-16 MED ORDER — EPHEDRINE 5 MG/ML INJ
10.0000 mg | INTRAVENOUS | Status: DC | PRN
  Filled 2013-08-16: qty 2

## 2013-08-16 MED ORDER — DIPHENHYDRAMINE HCL 50 MG/ML IJ SOLN: 12.5000 mg | INTRAMUSCULAR | Status: DC | PRN

## 2013-08-16 MED ORDER — OXYTOCIN 40 UNITS IN LACTATED RINGERS INFUSION - SIMPLE MED
62.5000 mL/h | INTRAVENOUS | Status: DC
  Filled 2013-08-16: qty 1000

## 2013-08-16 MED ORDER — LACTATED RINGERS IV SOLN
INTRAVENOUS | Status: DC
Start: 2013-08-16 — End: 2013-08-18
  Administered 2013-08-16 (×2): via INTRAVENOUS

## 2013-08-16 MED ORDER — PHENYLEPHRINE 40 MCG/ML (10ML) SYRINGE FOR IV PUSH (FOR BLOOD PRESSURE SUPPORT)
80.0000 ug | PREFILLED_SYRINGE | INTRAVENOUS | Status: DC | PRN
  Filled 2013-08-16: qty 2

## 2013-08-16 MED ORDER — CITRIC ACID-SODIUM CITRATE 334-500 MG/5ML PO SOLN: 30.0000 mL | ORAL | Status: DC | PRN

## 2013-08-16 MED ORDER — LACTATED RINGERS IV SOLN
500.0000 mL | Freq: Once | INTRAVENOUS | Status: AC
  Administered 2013-08-16: 500 mL via INTRAVENOUS

## 2013-08-16 MED ORDER — ONDANSETRON HCL 4 MG/2ML IJ SOLN
4.0000 mg | Freq: Four times a day (QID) | INTRAMUSCULAR | Status: DC | PRN
  Administered 2013-08-16: 4 mg via INTRAVENOUS
  Filled 2013-08-16: qty 2

## 2013-08-16 MED ORDER — FLEET ENEMA 7-19 GM/118ML RE ENEM: 1.0000 | ENEMA | RECTAL | Status: DC | PRN

## 2013-08-16 MED ORDER — OXYTOCIN BOLUS FROM INFUSION
500.0000 mL | INTRAVENOUS | Status: DC
  Administered 2013-08-17: 500 mL via INTRAVENOUS

## 2013-08-16 MED ORDER — LIDOCAINE HCL (PF) 1 % IJ SOLN
30.0000 mL | INTRAMUSCULAR | Status: DC | PRN
  Filled 2013-08-16: qty 30

## 2013-08-16 MED ORDER — OXYCODONE-ACETAMINOPHEN 5-325 MG PO TABS
1.0000 | ORAL_TABLET | ORAL | Status: DC | PRN
  Administered 2013-08-17: 2 via ORAL
  Filled 2013-08-16: qty 2

## 2013-08-16 MED ORDER — PHENYLEPHRINE 40 MCG/ML (10ML) SYRINGE FOR IV PUSH (FOR BLOOD PRESSURE SUPPORT)
80.0000 ug | PREFILLED_SYRINGE | INTRAVENOUS | Status: DC | PRN
Start: 2013-08-16 — End: 2013-08-18
  Filled 2013-08-16: qty 10
  Filled 2013-08-16: qty 2

## 2013-08-16 MED ORDER — FENTANYL 2.5 MCG/ML BUPIVACAINE 1/10 % EPIDURAL INFUSION (WH - ANES)
14.0000 mL/h | INTRAMUSCULAR | Status: DC | PRN
  Administered 2013-08-16: 14 mL/h via EPIDURAL
  Filled 2013-08-16: qty 125

## 2013-08-16 MED ORDER — ACETAMINOPHEN 325 MG PO TABS
650.0000 mg | ORAL_TABLET | ORAL | Status: DC | PRN
  Administered 2013-08-17: 650 mg via ORAL
  Filled 2013-08-16: qty 2

## 2013-08-16 MED ORDER — IBUPROFEN 600 MG PO TABS
600.0000 mg | ORAL_TABLET | Freq: Four times a day (QID) | ORAL | Status: DC | PRN
  Administered 2013-08-17: 600 mg via ORAL
  Filled 2013-08-16: qty 1

## 2013-08-16 NOTE — MAU Note (Signed)
Urine in lab 

## 2013-08-16 NOTE — Anesthesia Preprocedure Evaluation (Addendum)
Anesthesia Evaluation  Patient identified by MRN, date of birth, ID band Patient awake    Reviewed: Allergy & Precautions, H&P , Patient's Chart, lab work & pertinent test results  Airway Mallampati: III TM Distance: >3 FB Neck ROM: Full    Dental no notable dental hx. (+) Teeth Intact   Pulmonary neg pulmonary ROS,  breath sounds clear to auscultation  Pulmonary exam normal       Cardiovascular negative cardio ROS  Rhythm:Regular Rate:Normal     Neuro/Psych negative neurological ROS  negative psych ROS   GI/Hepatic Neg liver ROS, GERD-  Medicated and Controlled,  Endo/Other  Obesity  Renal/GU Hx/o pyelonephritis  negative genitourinary   Musculoskeletal negative musculoskeletal ROS (+)   Abdominal (+) + obese,   Peds  Hematology  (+) anemia ,   Anesthesia Other Findings   Reproductive/Obstetrics (+) Pregnancy                          Anesthesia Physical Anesthesia Plan  ASA: II  Anesthesia Plan: Epidural   Post-op Pain Management:    Induction:   Airway Management Planned: Natural Airway  Additional Equipment:   Intra-op Plan:   Post-operative Plan:   Informed Consent: I have reviewed the patients History and Physical, chart, labs and discussed the procedure including the risks, benefits and alternatives for the proposed anesthesia with the patient or authorized representative who has indicated his/her understanding and acceptance.     Plan Discussed with: Anesthesiologist  Anesthesia Plan Comments:         Anesthesia Quick Evaluation

## 2013-08-16 NOTE — Discharge Instructions (Signed)
Keep scheduled appointments. Contact primary obstetric provider for concerns.

## 2013-08-16 NOTE — H&P (Signed)
LABOR ADMISSION HISTORY AND PHYSICAL  [name] BullionGloria Kauzlarich is a 23 y.o. female G3P0011 with IUP at 6469w4d presenting for active labor. Contractions started early this AM but have worsened over the last few hours.  No lof, vb. +FM.   [facility]. Normal labs. Normal US.   Prenatal History/Complications:  Past Medical History: No past medical history on file.  Past Surgical History: Past Surgical History  Procedure Laterality Date  . No past surgeries      Obstetrical History: OB History   Grav Para Term Preterm Abortions TAB SAB Ect Mult Living   3 1   1  1   1     G1- NSVD, 7lb4oz G2- SAB G3- current   Social History: History   Social History  . Marital Status: Single    Spouse Name: N/A    Number of Children: N/A  . Years of Education: N/A   Social History Main Topics  . Smoking status: Never Smoker   . Smokeless tobacco: Never Used  . Alcohol Use: No  . Drug Use: No  . Sexual Activity: Yes    Birth Control/ Protection: None   Other Topics Concern  . Not on file   Social History Narrative  . No narrative on file    Family History: Family History  Problem Relation Age of Onset  . Asthma Mother   . Cancer Father   . Cancer Paternal Aunt   . Cancer Paternal Uncle     Allergies: No Known Allergies  Prescriptions prior to admission  Medication Sig Dispense Refill  . calcium carbonate (TUMS - DOSED IN MG ELEMENTAL CALCIUM) 500 MG chewable tablet Chew 2 tablets by mouth 3 (three) times daily as needed for indigestion or heartburn.      . Prenatal Vit-Fe Fumarate-FA (PRENATAL MULTIVITAMIN) TABS tablet Take 1 tablet by mouth daily at 12 noon.         Review of Systems   All systems reviewed and negative except as stated in HPI  Blood pressure 134/82, temperature 97.9 F (36.6 C), temperature source Oral, resp. rate 20, last menstrual period 11/12/2012. General appearance: alert, cooperative and no distress Lungs: clear to  auscultation bilaterally Heart: regular rate and rhythm Abdomen: soft, non-tender; bowel sounds normal Extremities: Homans sign is negative, no sign of DVT  Presentation: cephalic Fetal monitoringBaseline: 145 bpm, Variability: Good {> 6 bpm), Accelerations: Reactive and Decelerations: Absent Uterine activityNone Dilation: 4 Effacement (%): 80 Station: -1;-2 Exam by:: K.WIlosn,RN   Prenatal labs: ABO, Rh:   Antibody:   Rubella:   RPR:    HBsAg:    HIV:    GBS: Negative (07/02 0000)     Prenatal Transfer Tool  Maternal Diabetes: No Genetic Screening: Normal Maternal Ultrasounds/Referrals: Normal Fetal Ultrasounds or other Referrals:  None Maternal Substance Abuse:  No Significant Maternal Medications:  None Significant Maternal Lab Results: None     No results found for this or any previous visit (from the past 24 hour(s)).  Assessment: [name] BullionGloria Miu is a 23 y.o. G3P0011 at 2169w4d here for active labor at term.     #Labor: active labor. BBOW. AROM performed at pt request.  #Pain: Epidural prn #FWB: Cat I tracing, EFW 8.5-9#s #ID:  GBS neg #MOF: breast    Stellah Donovan L 08/16/2013, 5:00 PM

## 2013-08-16 NOTE — Progress Notes (Signed)
Lavena BullionGloria Chhim is a 23 y.o. G3P0011 at 568w4d admitted for active labor  Subjective: Comfortable at this time. AROM at 7:30pm. Just checked by RN. No complaints.   Objective: BP 119/75  Pulse 75  Temp(Src) 98.3 F (36.8 C) (Oral)  Resp 18  Ht 5\' 3"  (1.6 m)  Wt 84.369 kg (186 lb)  BMI 32.96 kg/m2  SpO2 99%  LMP 11/12/2012   Total I/O In: -  Out: 250 [Urine:250]  FHT:  FHR: 135 bpm, variability: moderate,  accelerations:  Present,  decelerations:  Absent UC:   regular, every 5 minutes SVE:   Dilation: Lip/rim Effacement (%): 100 Station: +1 Exam by:: [place] Mensah  Labs: Lab Results  Component Value Date   WBC 17.7* 08/16/2013   HGB 11.7* 08/16/2013   HCT 32.7* 08/16/2013   MCV 86.3 08/16/2013   PLT 221 08/16/2013    Assessment / Plan: Spontaneous labor, progressing normally  Labor: Progressing normally s/p AROM Preeclampsia:  no signs or symptoms of toxicity Fetal Wellbeing:  Category I Pain Control:  Epidural I/D:  n/a Anticipated MOD:  NSVD  Lea Baine G 08/16/2013, 11:58 PM

## 2013-08-17 ENCOUNTER — Encounter (HOSPITAL_COMMUNITY): Payer: Self-pay | Admitting: General Practice

## 2013-08-17 DIAGNOSIS — D649 Anemia, unspecified: Secondary | ICD-10-CM

## 2013-08-17 DIAGNOSIS — K219 Gastro-esophageal reflux disease without esophagitis: Secondary | ICD-10-CM

## 2013-08-17 DIAGNOSIS — O9902 Anemia complicating childbirth: Secondary | ICD-10-CM

## 2013-08-17 LAB — CBC
HCT: 31.6 % — ABNORMAL LOW (ref 36.0–46.0)
Hemoglobin: 11.2 g/dL — ABNORMAL LOW (ref 12.0–15.0)
MCH: 30.8 pg (ref 26.0–34.0)
MCHC: 35.4 g/dL (ref 30.0–36.0)
MCV: 86.8 fL (ref 78.0–100.0)
Platelets: 181 10*3/uL (ref 150–400)
RBC: 3.64 MIL/uL — ABNORMAL LOW (ref 3.87–5.11)
RDW: 13 % (ref 11.5–15.5)
WBC: 17.4 10*3/uL — AB (ref 4.0–10.5)

## 2013-08-17 LAB — RPR

## 2013-08-17 MED ORDER — OXYCODONE-ACETAMINOPHEN 5-325 MG PO TABS
1.0000 | ORAL_TABLET | ORAL | Status: DC | PRN
  Administered 2013-08-18: 1 via ORAL
  Filled 2013-08-17: qty 1

## 2013-08-17 MED ORDER — ONDANSETRON HCL 4 MG PO TABS
4.0000 mg | ORAL_TABLET | ORAL | Status: DC | PRN
  Administered 2013-08-17: 4 mg via ORAL
  Filled 2013-08-17: qty 1

## 2013-08-17 MED ORDER — ONDANSETRON HCL 4 MG/2ML IJ SOLN: 4.0000 mg | INTRAMUSCULAR | Status: DC | PRN

## 2013-08-17 MED ORDER — SENNOSIDES-DOCUSATE SODIUM 8.6-50 MG PO TABS
2.0000 | ORAL_TABLET | ORAL | Status: DC
  Administered 2013-08-18: 2 via ORAL
  Filled 2013-08-17: qty 2

## 2013-08-17 MED ORDER — BENZOCAINE-MENTHOL 20-0.5 % EX AERO: 1.0000 "application " | INHALATION_SPRAY | CUTANEOUS | Status: DC | PRN

## 2013-08-17 MED ORDER — PRENATAL MULTIVITAMIN CH
1.0000 | ORAL_TABLET | Freq: Every day | ORAL | Status: DC
  Administered 2013-08-17 – 2013-08-18 (×2): 1 via ORAL
  Filled 2013-08-17 (×2): qty 1

## 2013-08-17 MED ORDER — DIBUCAINE 1 % RE OINT: 1.0000 "application " | TOPICAL_OINTMENT | RECTAL | Status: DC | PRN

## 2013-08-17 MED ORDER — SIMETHICONE 80 MG PO CHEW: 80.0000 mg | CHEWABLE_TABLET | ORAL | Status: DC | PRN

## 2013-08-17 MED ORDER — MISOPROSTOL 200 MCG PO TABS
ORAL_TABLET | ORAL | Status: AC
  Filled 2013-08-17: qty 4

## 2013-08-17 MED ORDER — WITCH HAZEL-GLYCERIN EX PADS: 1.0000 "application " | MEDICATED_PAD | CUTANEOUS | Status: DC | PRN

## 2013-08-17 MED ORDER — DIPHENHYDRAMINE HCL 25 MG PO CAPS: 25.0000 mg | ORAL_CAPSULE | Freq: Four times a day (QID) | ORAL | Status: DC | PRN

## 2013-08-17 MED ORDER — LIDOCAINE HCL (PF) 1 % IJ SOLN
INTRAMUSCULAR | Status: DC | PRN
  Administered 2013-08-16 (×2): 4 mL

## 2013-08-17 MED ORDER — MISOPROSTOL 200 MCG PO TABS
800.0000 ug | ORAL_TABLET | Freq: Once | ORAL | Status: AC
  Administered 2013-08-17: 800 ug via VAGINAL

## 2013-08-17 MED ORDER — IBUPROFEN 600 MG PO TABS
600.0000 mg | ORAL_TABLET | Freq: Four times a day (QID) | ORAL | Status: DC
  Administered 2013-08-17 – 2013-08-18 (×6): 600 mg via ORAL
  Filled 2013-08-17 (×6): qty 1

## 2013-08-17 MED ORDER — FENTANYL 2.5 MCG/ML BUPIVACAINE 1/10 % EPIDURAL INFUSION (WH - ANES)
INTRAMUSCULAR | Status: DC | PRN
  Administered 2013-08-16: 14 mL/h via EPIDURAL

## 2013-08-17 MED ORDER — LANOLIN HYDROUS EX OINT
TOPICAL_OINTMENT | CUTANEOUS | Status: DC | PRN
Start: 2013-08-17 — End: 2013-08-18

## 2013-08-17 MED ORDER — ZOLPIDEM TARTRATE 5 MG PO TABS
5.0000 mg | ORAL_TABLET | Freq: Every evening | ORAL | Status: DC | PRN
Start: 2013-08-17 — End: 2013-08-18

## 2013-08-17 MED ORDER — TETANUS-DIPHTH-ACELL PERTUSSIS 5-2.5-18.5 LF-MCG/0.5 IM SUSP: 0.5000 mL | Freq: Once | INTRAMUSCULAR | Status: DC

## 2013-08-17 NOTE — Progress Notes (Signed)
I have seen and examined this patient and I agree with the above. Cam HaiSHAW, Warwick Nick CNM 9:00 AM 08/17/2013

## 2013-08-17 NOTE — Anesthesia Postprocedure Evaluation (Signed)
Anesthesia Post Note  Patient: [name] Warner  Procedure(s) Performed: * No procedures listed *  Anesthesia type: Epidural  Patient location: Mother/Baby  Post pain: Pain level controlled  Post assessment: Post-op Vital signs reviewed  Last Vitals:  Filed Vitals:   08/17/13 0449  BP: 103/60  Pulse: 82  Temp: 36.6 C  Resp: 16    Post vital signs: Reviewed  Level of consciousness: awake  Complications: No apparent anesthesia complications

## 2013-08-17 NOTE — Lactation Note (Signed)
This note was copied from the chart of [name] Warner Schwanke. Lactation Consultation Note  Patient Name: [name] Warner Frankson ZOXWR'UToday's Date: 08/17/2013 Reason for consult: Initial assessment [employer] [phone] used for visit. Mom is experienced BF. Reports baby nursed well immediately after birth but has been spitty and sleepy since. Baby recently fed for 25 minutes prior to my visit. Basic teaching reviewed with Mom, cluster feeding discussed. Lactation brochure left for review. Advised of OP services and support group. Encouraged Mom to call if she would like LC assist or has questions/concerns.   Maternal Data Formula Feeding for Exclusion: No Infant to breast within first hour of birth: Yes Has patient been taught Hand Expression?: Yes Does the patient have breastfeeding experience prior to this delivery?: Yes  Feeding Feeding Type: Breast Fed Length of feed: 25 min  LATCH Score/Interventions                      Lactation Tools Discussed/Used WIC Program: Yes   Consult Status Consult Status: Follow-up Date: 08/18/13 Follow-up type: In-patient    Alfred LevinsGranger, Lindsee Labarre Ann 08/17/2013, 10:41 AM

## 2013-08-18 NOTE — Discharge Instructions (Signed)

## 2013-08-18 NOTE — Discharge Summary (Signed)
Obstetric Discharge Summary Reason for Admission: onset of labor Prenatal Procedures: ultrasound Intrapartum Procedures: spontaneous vaginal delivery Postpartum Procedures: none Complications-Operative and Postpartum: none Hemoglobin  Date Value Ref Range Status  08/17/2013 11.2* 12.0 - 15.0 g/dL Final     HCT  Date Value Ref Range Status  08/17/2013 31.6* 36.0 - 46.0 % Final    Physical Exam:  General: alert, cooperative and no distress Lochia: appropriate Uterine Fundus: firm Incision: N/A DVT Evaluation: No evidence of DVT seen on physical exam. Negative Homan's sign. No cords or calf tenderness. Calf/Ankle edema is present.  Discharge Diagnoses: Term Pregnancy-delivered  Discharge Information: Date: 08/18/2013 Activity: pelvic rest Diet: routine Medications: PNV and Ibuprofen Condition: stable Instructions: refer to practice specific booklet Discharge to: home  Ms. [name] Warner is a [age] year old 663P1012 who presented for active labor on 08/17/13 and delivered via NSVD later that day. She is breast and bottle feeding her baby girl. She plans on using Mirena for birth control. She states that she feels well and requests discharge today.  Newborn Data: Live born female  Birth Weight: 7 lb 10.6 oz (3475 g) APGAR: 9, 9  Home with mother.  [name] Warner, [name] 08/18/2013, 7:51 AM  I have seen and examined this patient and agree the above assessment. CRESENZO-DISHMAN,Clenton Esper 08/19/2013 12:38 PM

## 2013-08-18 NOTE — Lactation Note (Signed)
This note was copied from the chart of [name] Warner. Lactation Consultation Note  Patient Name: [name] Lavena BullionGloria Below ONGEX'BToday's Date: 08/18/2013 Reason for consult: Follow-up assessment Per mom recently breast fed and gave formula. LC updated doc flow sheet. LC discussed supply and demand and prevention of sore nipple and engorgement. Per mom my nipples are tender initially and improves. LC instructed on use of hand pump. Mother informed of post-discharge support and given phone number to the lactation department, including services for phone call assistance; out-patient appointments; and breastfeeding support group. List of other breastfeeding resources in the community given in the handout. Encouraged mother to call for problems or concerns related to breastfeeding.   Maternal Data Formula Feeding for Exclusion: No Has patient been taught Hand Expression?: Yes (per mom has been shown ) Does the patient have breastfeeding experience prior to this delivery?: Yes  Feeding Feeding Type:  (recently fed ) Length of feed: 15 min (15-20 mins per mom )  LATCH Score/Interventions                      Lactation Tools Discussed/Used Tools: Pump Breast pump type: Manual WIC Program: Yes Pump Review: Setup, frequency, and cleaning;Milk Storage Initiated by:: MAI  Date initiated:: 08/18/13   Consult Status Consult Status: Complete    [name] Warner, [name] Warner 08/18/2013, 12:31 PM

## 2013-08-21 NOTE — Discharge Summary (Signed)
Attestation of Attending Supervision of Advanced Practitioner (CNM/NP): Evaluation and management procedures were performed by the Advanced Practitioner under my supervision and collaboration. I have reviewed the Advanced Practitioner's note and chart, and I agree with the management and plan.  Denishia Citro H. 2:04 PM

## 2013-11-04 DIAGNOSIS — T7840XA Allergy, unspecified, initial encounter: Secondary | ICD-10-CM | POA: Insufficient documentation

## 2013-11-04 DIAGNOSIS — Y929 Unspecified place or not applicable: Secondary | ICD-10-CM | POA: Insufficient documentation

## 2013-11-04 DIAGNOSIS — Y939 Activity, unspecified: Secondary | ICD-10-CM | POA: Insufficient documentation

## 2013-11-04 DIAGNOSIS — R21 Rash and other nonspecific skin eruption: Secondary | ICD-10-CM | POA: Insufficient documentation

## 2013-11-04 DIAGNOSIS — X58XXXA Exposure to other specified factors, initial encounter: Secondary | ICD-10-CM | POA: Insufficient documentation

## 2013-11-04 NOTE — ED Notes (Signed)
The pt is in the peds dept with her child

## 2013-11-04 NOTE — ED Notes (Signed)
Pt remains in peds dept unable to triage the pt

## 2013-11-05 ENCOUNTER — Emergency Department (HOSPITAL_COMMUNITY)
Admission: EM | Admit: 2013-11-05 | Discharge: 2013-11-05 | Disposition: A | Discharge status: Home / Self Care | Payer: Medicaid Other | Attending: Emergency Medicine | Admitting: Emergency Medicine

## 2013-11-05 ENCOUNTER — Encounter (HOSPITAL_COMMUNITY): Payer: Self-pay | Admitting: Emergency Medicine

## 2013-11-05 DIAGNOSIS — T7840XA Allergy, unspecified, initial encounter: Secondary | ICD-10-CM

## 2013-11-05 MED ORDER — FAMOTIDINE 20 MG PO TABS
40.0000 mg | ORAL_TABLET | Freq: Once | ORAL | Status: AC
  Administered 2013-11-05: 40 mg via ORAL
  Filled 2013-11-05: qty 2

## 2013-11-05 MED ORDER — PREDNISONE 20 MG PO TABS
40.0000 mg | ORAL_TABLET | Freq: Once | ORAL | Status: AC
  Administered 2013-11-05: 40 mg via ORAL
  Filled 2013-11-05: qty 2

## 2013-11-05 MED ORDER — PREDNISONE 20 MG PO TABS: 40.0000 mg | ORAL_TABLET | Freq: Every day | ORAL | Status: AC

## 2013-11-05 MED ORDER — DIPHENHYDRAMINE HCL 25 MG PO CAPS
25.0000 mg | ORAL_CAPSULE | Freq: Once | ORAL | Status: AC
  Administered 2013-11-05: 25 mg via ORAL
  Filled 2013-11-05: qty 1

## 2013-11-05 MED ORDER — FAMOTIDINE 20 MG PO TABS: 20.0000 mg | ORAL_TABLET | Freq: Two times a day (BID) | ORAL | Status: AC

## 2013-11-05 NOTE — Discharge Instructions (Signed)
Please follow the directions provided. Be sure to followup with your doctor this week about your rash and itching. Take the medicines as prescribed. They are safe to take while you are nursing her baby. Feel free to return to the emergency room if you have any new or worsening symptoms including shortness of breath,chest pain, abdominal pain, nausea or vomiting, or the rash and itching do not improve.  INSTRUCCIONES PARA EL CUIDADO DOMICILIARIO  Evite las sustancias que han causado la erupcin.  No se rasque la lesin. Puede ocasionarle una infeccin.  Tome baos con agua fresca para Psychologist, sport and exercisedetener la picazn.  Tome slo medicamentos de venta libre o recetados, segn las indicaciones del mdico.  Cumpla con todas las visitas de control, segn le indique su mdico. SOLICITE ATENCIN MDICA DE INMEDIATO SI:  El dolor, la hinchazn o el enrojecimiento [place] Lakes[place].  Tiene fiebre.  Tiene sntomas nuevos o graves.  Siente dolor en el cuerpo, diarrea o vmitos.  La erupcin no mejora en el trmino de 3 das.

## 2013-11-05 NOTE — ED Notes (Signed)
Patient here with complaint of diffuse skin rash. States that it began last Monday and has spread to her child. Child was seen by Peds MD and was diagnosed with candida infection of skin and prescribed nystatin cream. Rash appear currently as reddened areas on the skin with some raised inflamed bumps. Patient took benadryl and used hydrocortisone cream at 1830.

## 2013-11-05 NOTE — ED Provider Notes (Signed)
CSN: [id number]     Arrival date & time 11/04/13  2159 History   First MD Initiated Contact with Patient 11/05/13 0021     Chief Complaint  Patient presents with  . Rash   (Consider location/radiation/quality/duration/timing/severity/associated sxs/prior Treatment) HPI [name] Warner is a 23 yo female presenting with report of a rash x 5 days.  She reports it started it as a rash on her legs and has spread to her arms.  She has used benadryl and hydrocortisone cream with intermittent relief but the itching returns when the medicine wears off.  She denies shortness of breath, abd pain or feeling of mouth or throat closing.  History reviewed. No pertinent past medical history. History reviewed. No pertinent past surgical history. No family history on file. History  Substance Use Topics  . Smoking status: Never Smoker   . Smokeless tobacco: Not on file  . Alcohol Use: No   OB History   Grav Para Term Preterm Abortions TAB SAB Ect Mult Living                 Review of Systems  Constitutional: Negative for fever and chills.  Eyes: Negative for visual disturbance.  Respiratory: Negative for shortness of breath.   Cardiovascular: Negative for chest pain.  Gastrointestinal: Negative for nausea, vomiting and diarrhea.  Musculoskeletal: Negative for myalgias.  Skin: Positive for rash.  Neurological: Negative for numbness and headaches.    Allergies  Review of patient's allergies indicates no known allergies.  Home Medications   Prior to Admission medications   Not on File   BP 114/66  Pulse 74  Temp(Src) 97.6 F (36.4 C) (Oral)  Resp 16  Ht 5\' 4"  (1.626 m)  Wt 160 lb (72.576 kg)  BMI 27.45 kg/m2  SpO2 99%  LMP 10/09/2013 Physical Exam  Nursing note and vitals reviewed. Constitutional: She appears well-developed and well-nourished. No distress.  HENT:  Head: Normocephalic and atraumatic.  Mouth/Throat: Oropharynx is clear and moist. No oropharyngeal exudate.   Eyes: Conjunctivae are normal.  Neck: Neck supple. No thyromegaly present.  Cardiovascular: Normal rate, regular rhythm and intact distal pulses.   Pulmonary/Chest: Effort normal and breath sounds normal. No respiratory distress. She has no wheezes. She has no rales. She exhibits no tenderness.  Abdominal: Soft. There is no tenderness.  Musculoskeletal: She exhibits no tenderness.  Lymphadenopathy:    She has no cervical adenopathy.  Neurological: She is alert.  Skin: Skin is warm and dry. No rash noted. She is not diaphoretic.  Psychiatric: She has a normal mood and affect.    ED Course  Procedures (including critical care time) Labs Review Labs Reviewed - No data to display  Imaging Review No results found.   EKG Interpretation None      MDM   Final diagnoses:  Allergic reaction, initial encounter   23 yo female with rash and mild allergic rxn. Patient re-evaluated prior to dc, is hemodynamically stable, in no respiratory distress, and denies the feeling of throat closing. Pt has been advised to take OTC benadryl & return to the ED if they have a mod-severe allergic rxn (s/s including throat closing, difficulty breathing, swelling of lips face or tongue). Pt is to follow up with their PCP. Pt is agreeable with plan & verbalizes understanding.   Filed Vitals:   11/05/13 0018 11/05/13 0141  BP: 114/66 112/63  Pulse: 74 74  Temp: 97.6 F (36.4 C) 97.6 F (36.4 C)  TempSrc: Oral Oral  Resp: 16  16  Height: 5\' 4"  (1.626 m)   Weight: 160 lb (72.576 kg)   SpO2: 99% 100%   Meds given in ED:  Medications  predniSONE (DELTASONE) tablet 40 mg (40 mg Oral Given 11/05/13 0043)  diphenhydrAMINE (BENADRYL) capsule 25 mg (25 mg Oral Given 11/05/13 0043)  famotidine (PEPCID) tablet 40 mg (40 mg Oral Given 11/05/13 0043)    Discharge Medication List as of 11/05/2013  1:29 AM    START taking these medications   Details  famotidine (PEPCID) 20 MG tablet Take 1 tablet (20 mg  total) by mouth 2 (two) times daily., Starting 11/05/2013, Until Discontinued, Print    predniSONE (DELTASONE) 20 MG tablet Take 2 tablets (40 mg total) by mouth daily., Starting 11/05/2013, Until Discontinued, Print            Harle Battiest, NP 11/10/13 2200

## 2013-11-12 NOTE — ED Provider Notes (Signed)
Medical screening examination/treatment/procedure(s) were performed by non-physician practitioner and as supervising physician I was immediately available for consultation/collaboration.   EKG Interpretation None        Alexas Basulto N Brodie Correll, DO 11/12/13 0103 

## 2013-11-29 IMAGING — US US OB TRANSVAGINAL
1 series · 13 of 28 positions shown · non-contrast
Comparison: None.

***ADDENDUM*** CREATED: 06/14/2012 [DATE]

Addendum to correct an error in format of the voice recognition
software template.  No change in report content.
CLINICAL DATA: Bleeding, pain.
TRANSVAGINAL OBSTETRIC US
TECHNIQUE: Transvaginal ultrasound was performed for complete
evaluation of the gestation as well as the maternal uterus, adnexal
regions, and pelvic cul-de-sac.

[Series 1: us ob transvaginal · 13 of 39 slices shown]
[im 2/39]
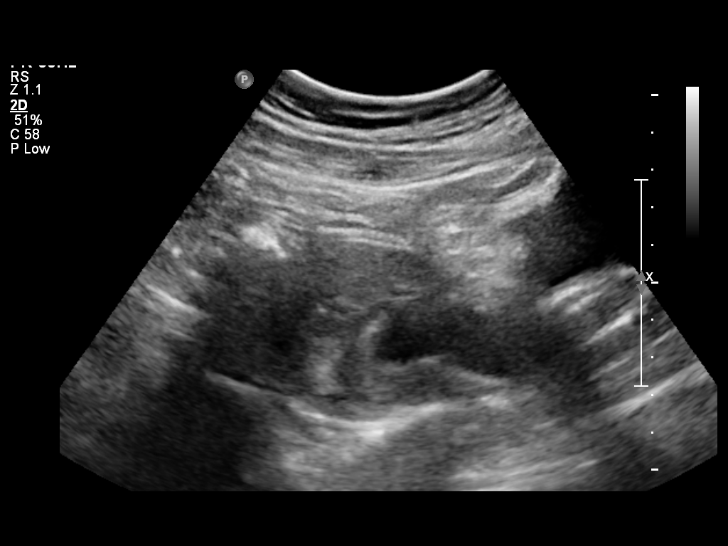
[im 5/39]
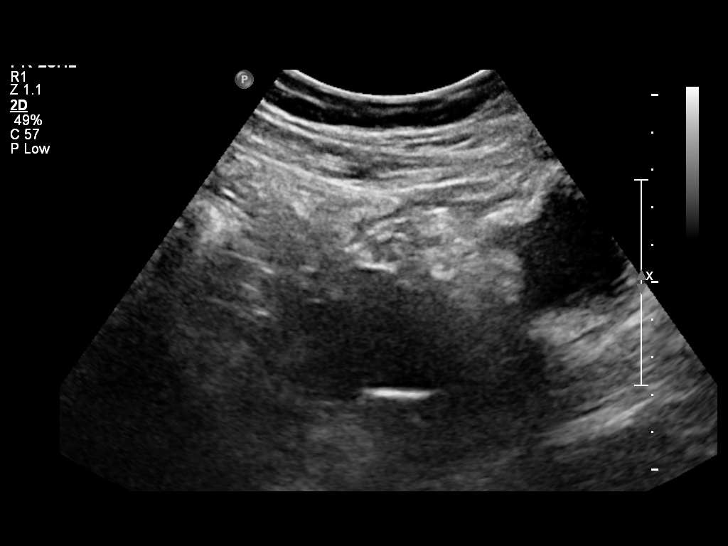
[im 8/39]
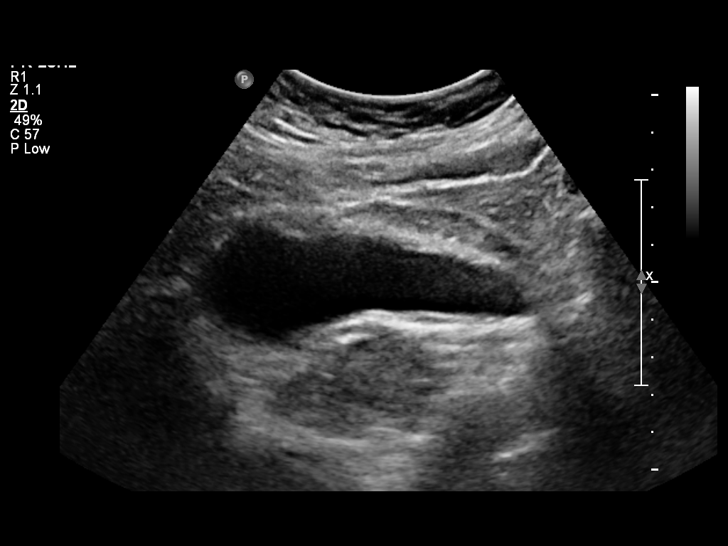
[im 10/39]
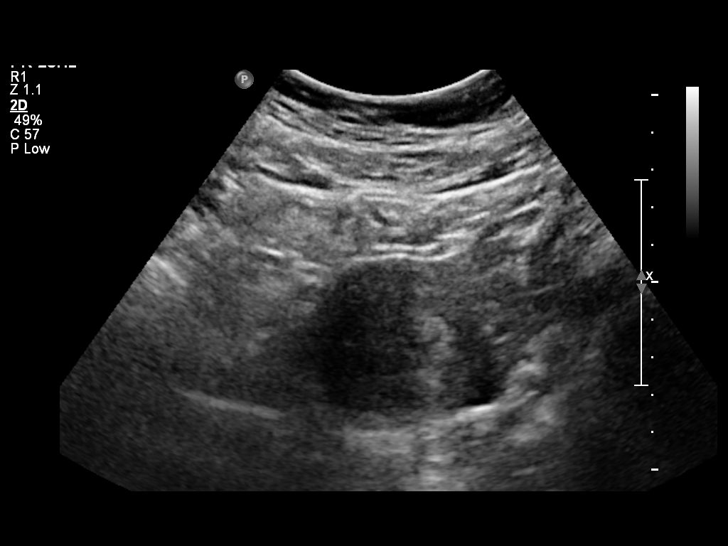
[im 13/39]
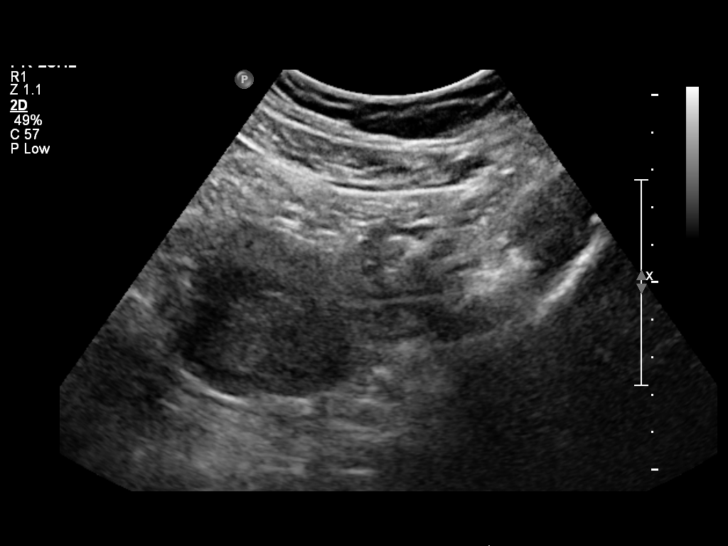
[im 16/39]
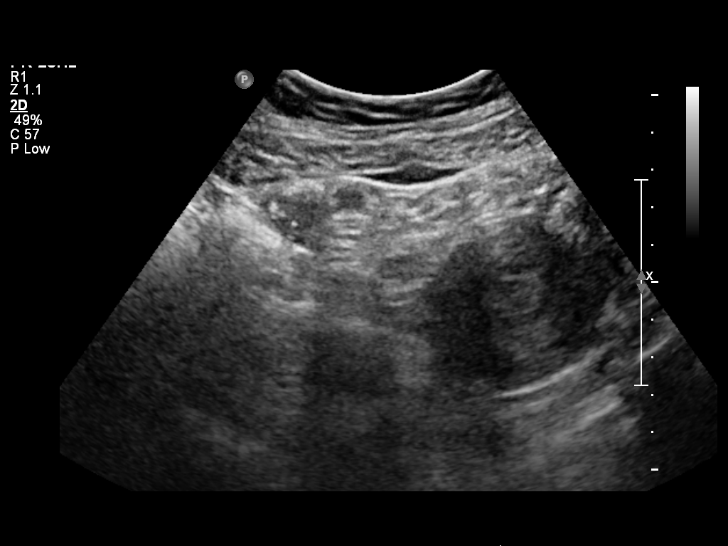
[im 20/39]
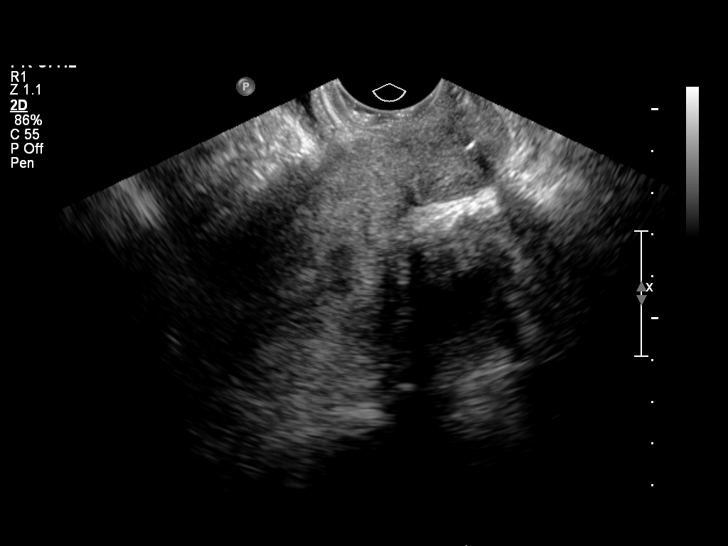
[im 23/39]
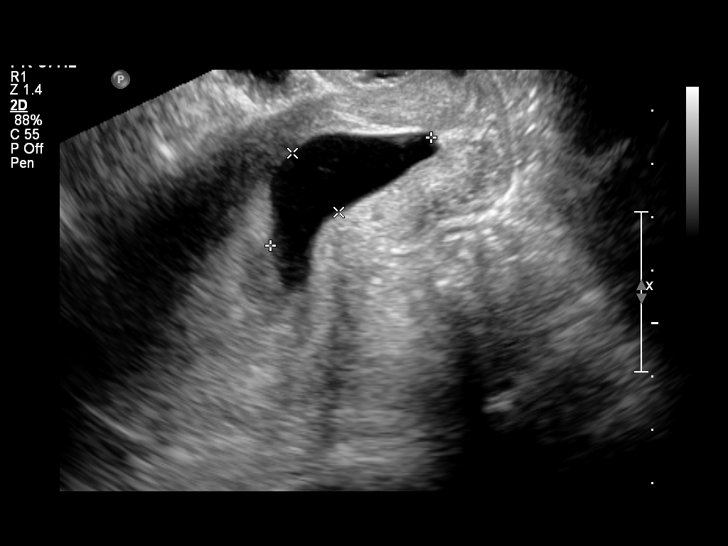
[im 26/39]
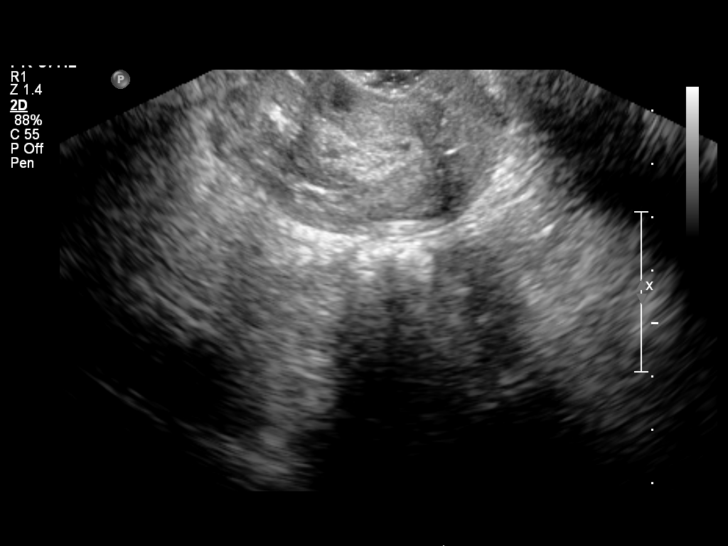
[im 29/39]
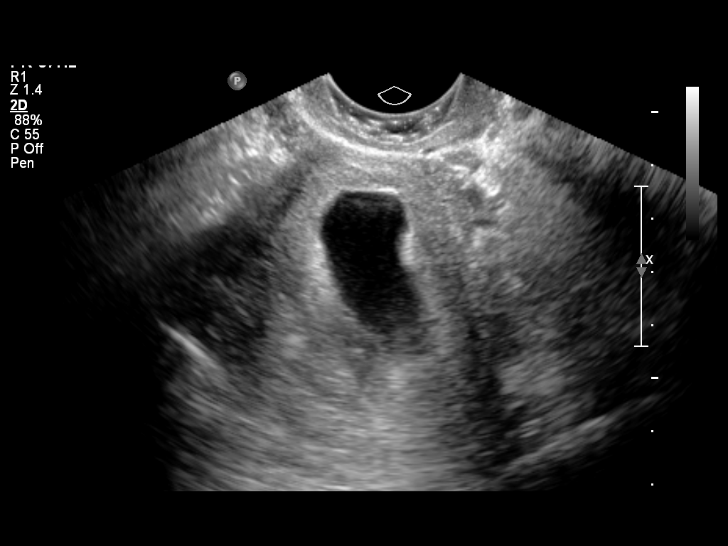
[im 31/39]
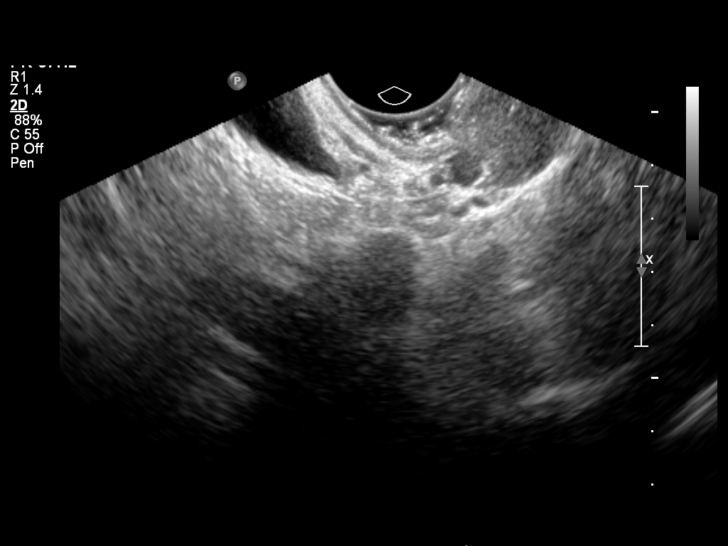
[im 34/39]
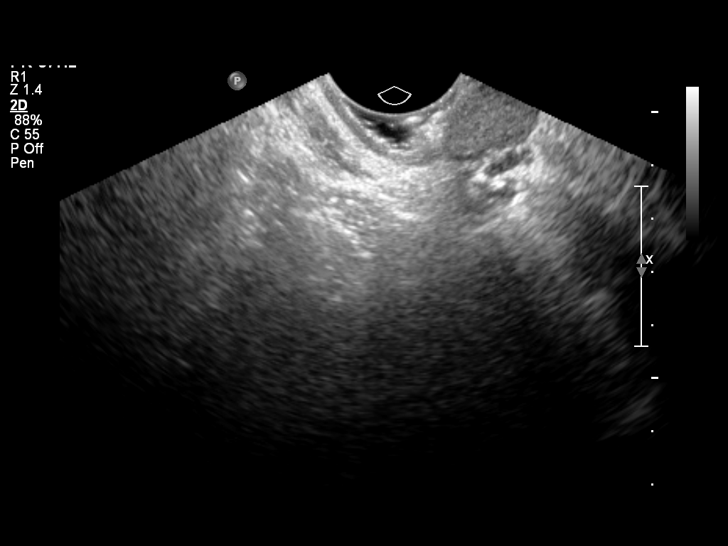
[im 37/39]
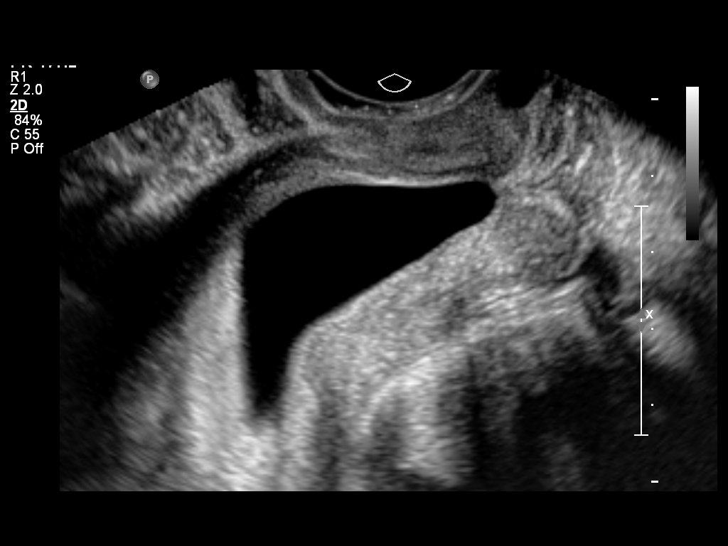

[13 of 28 positions shown; findings below may reference images not displayed]

Intrauterine gestational sac: Elongated, low in position.
Yolk sac: Not identified
Embryo: Not identified
Cardiac Activity: Not applicable
MSD: 22  mm

Maternal uterus/adnexae:
Ovaries not visualized.
IMPRESSION: Intrauterine gestational sac measures 22 mm, is low in position,
and without yolk sac or embryo.  Concerning for nonviable
pregnancy.  Recommend clinical/serial quantitative beta HCG
correlation and short-term sonographic follow-up.

Bleeding, pain

Elongatedand low in positionNot visualized
Embryo:  Not visualized.

Ovaries not visualized.

Intrauterine gestational sac measures 22 mm, is low in position,
and without yolk sac or embryo. Concerning for nonviable pregnancy.
Recommend clinical/ serial quantitative beta HCG correlation and
short-term sonographic follow-up.

## 2013-12-04 ENCOUNTER — Encounter (HOSPITAL_COMMUNITY): Payer: Self-pay | Admitting: General Practice

## 2014-06-01 IMAGING — US US OB COMP LESS 14 WK
1 series · 14 of 28 positions shown · non-contrast
Comparison: Pelvic ultrasound 06/14/2012.

CLINICAL DATA: Bleeding, spotting.

EXAM:
TRANSVAGINAL OB ULTRASOUND; OBSTETRIC <14 WK ULTRASOUND
TECHNIQUE: Transvaginal ultrasound was performed for complete evaluation of the
gestation as well as the maternal uterus, adnexal regions, and
pelvic cul-de-sac.

[Series 1: us ob comp less 14 wks · 14 of 52 slices shown]
[im 2/52]
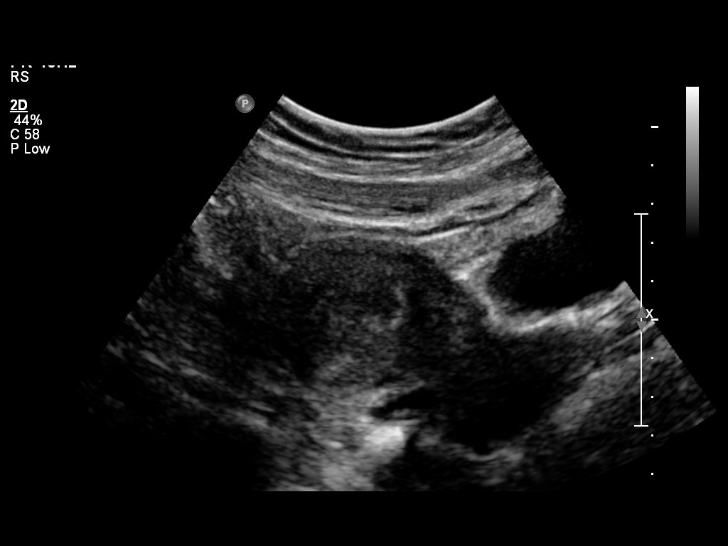
[im 6/52]
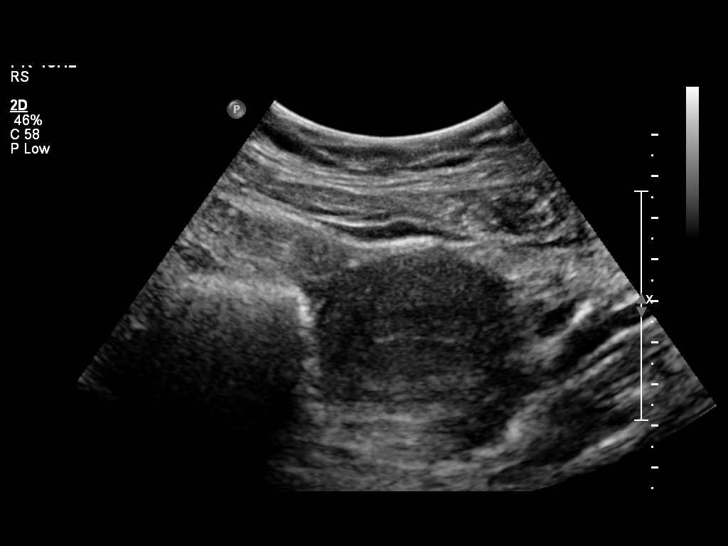
[im 10/52]
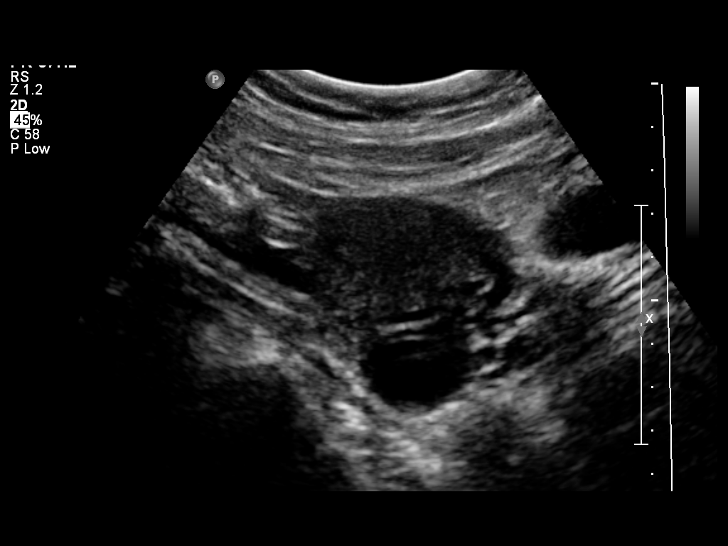
[im 14/52]
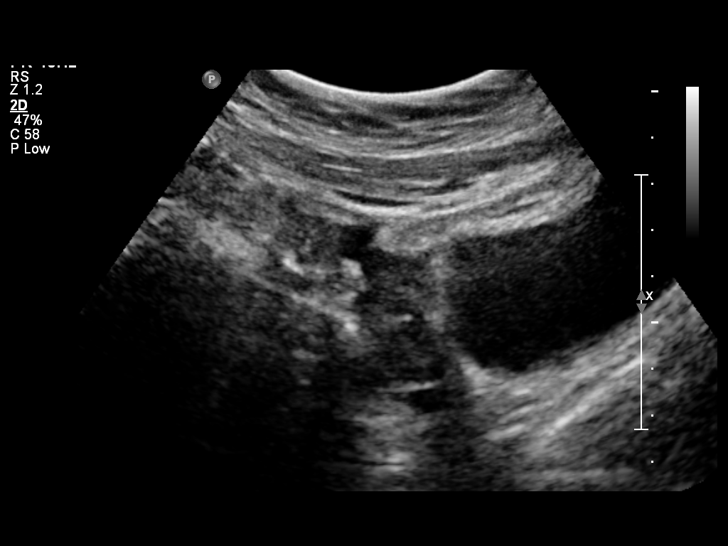
[im 18/52]
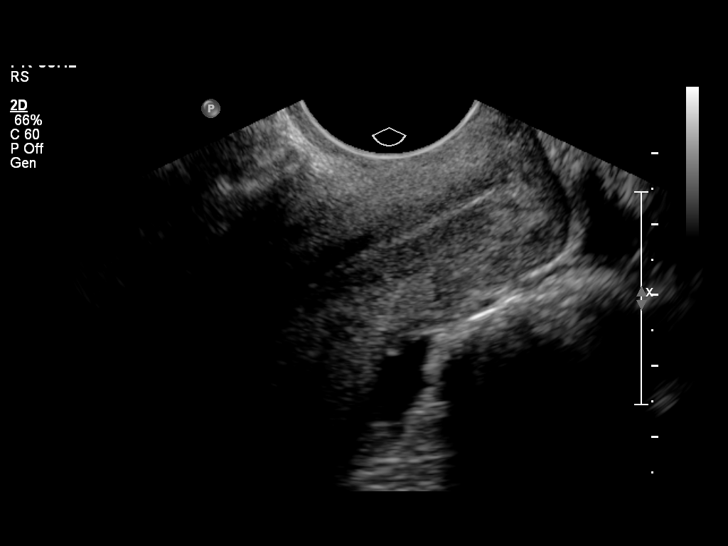
[im 21/52]
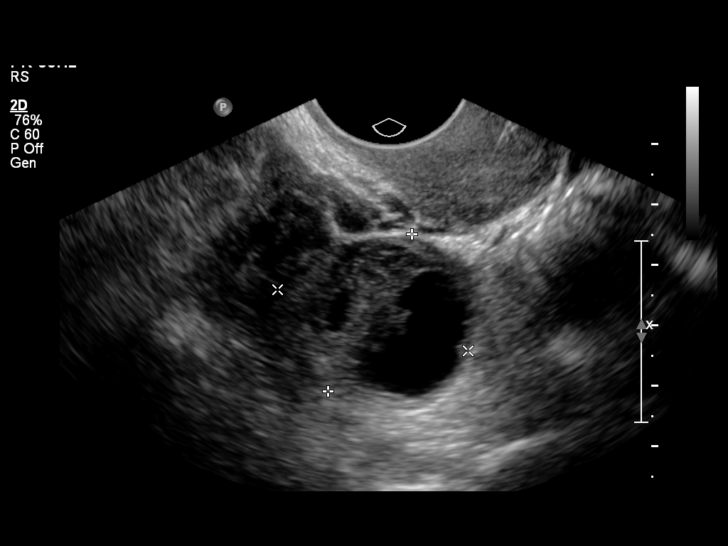
[im 25/52]
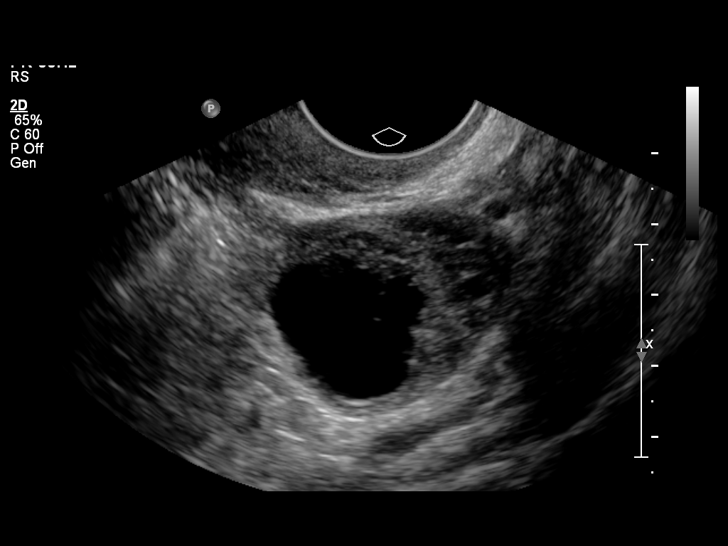
[im 29/52]
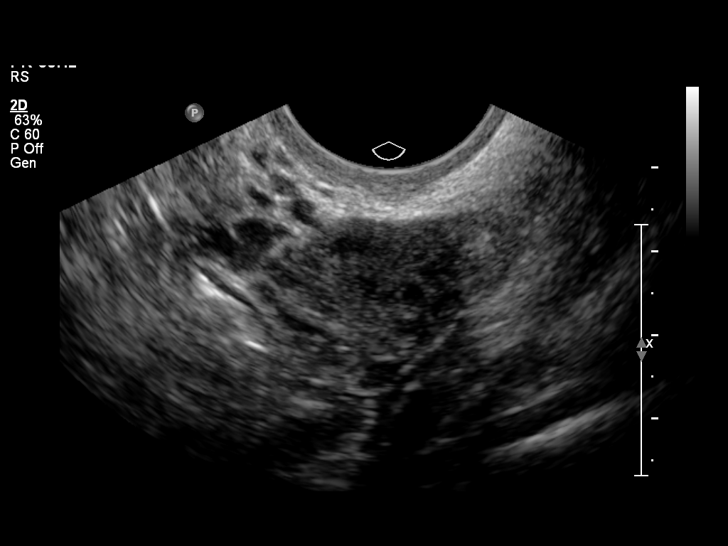
[im 33/52]
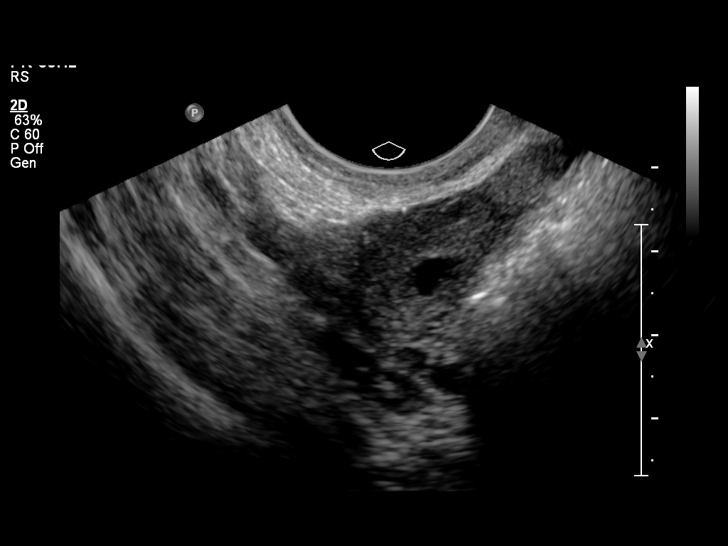
[im 36/52]
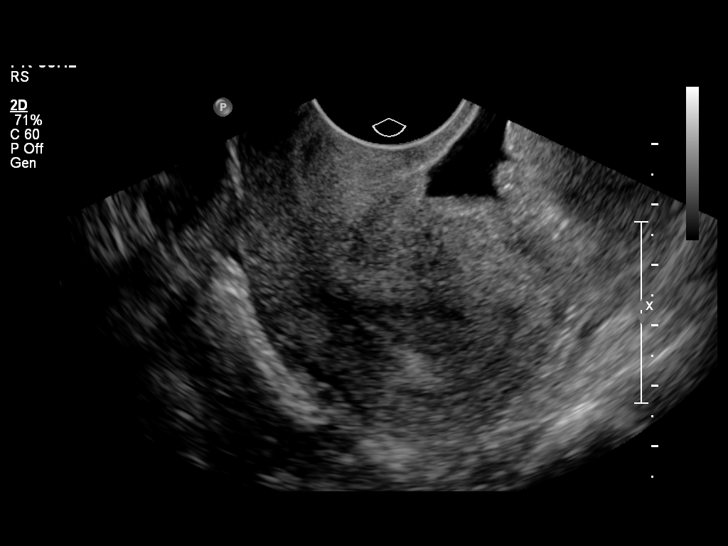
[im 40/52]
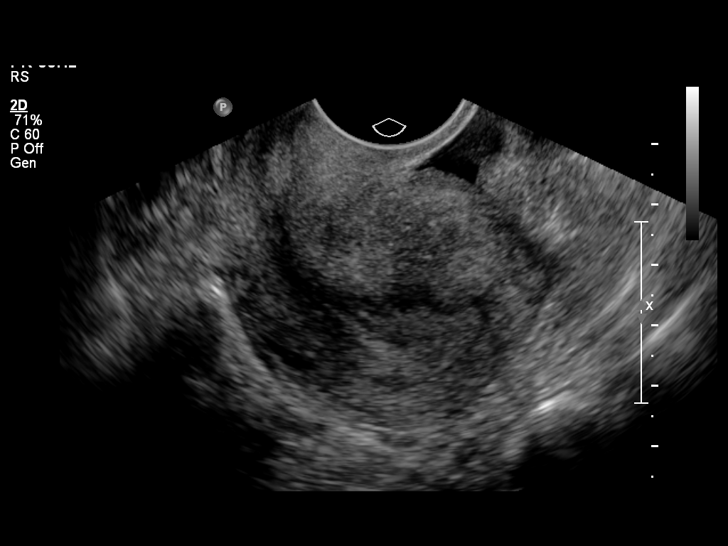
[im 44/52]
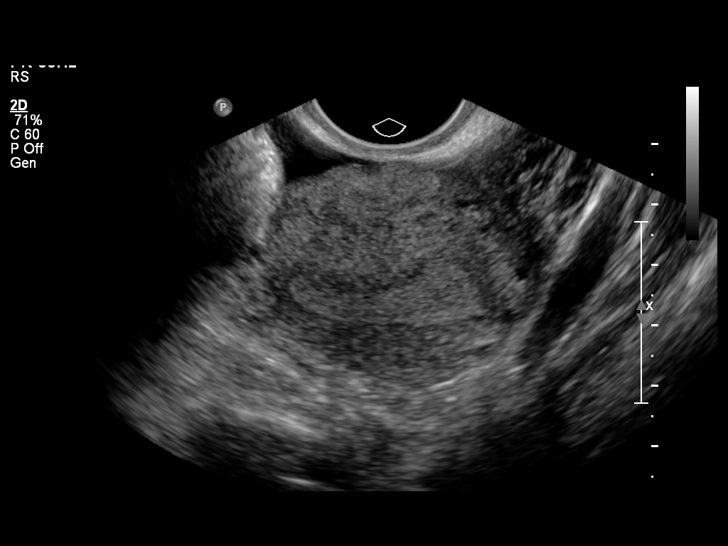
[im 48/52]
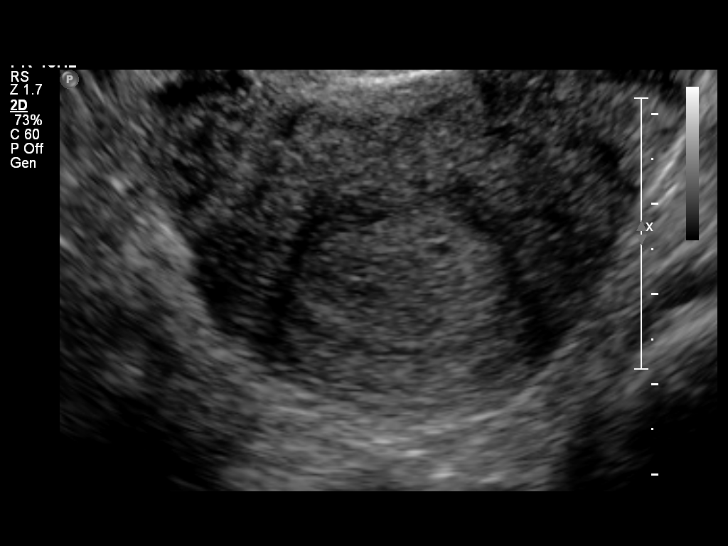
[im 52/52]
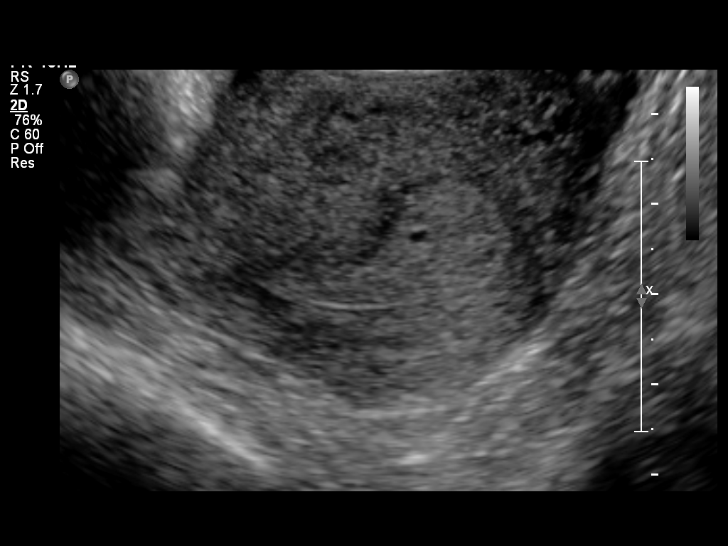

[14 of 28 positions shown; findings below may reference images not displayed]

FINDINGS: Intrauterine gestational sac: Possible tiny intrauterine gestational
sac.

Yolk sac:  Not visualized

Embryo:  Not visualized

Cardiac Activity: Not visualized

Maternal uterus/adnexae: Probable left corpus luteal cyst measuring
2.5 x 1.8 cm. Trace free fluid. Retroverted uterus.
IMPRESSION: Possible tiny intrauterine gestational sac. In the setting of
positive pregnancy test and no definite intrauterine pregnancy, this
reflects a pregnancy of unknown location. Differential
considerations include early normal IUP, abnormal IUP, or
nonvisualized ectopic pregnancy. Differentiation is achieved with
serial beta HCG supplemented by repeat sonography as clinically
warranted.

## 2014-06-09 IMAGING — US US OB TRANSVAGINAL
1 series · 14 of 28 positions shown · non-contrast
Comparison: 12/15/2012

CLINICAL DATA: Followup early pregnancy

EXAM:
TRANSVAGINAL OB ULTRASOUND
TECHNIQUE: Transvaginal ultrasound was performed for complete evaluation of the
gestation as well as the maternal uterus, adnexal regions, and
pelvic cul-de-sac.

[Series 1: us ob transvaginal · 14 of 46 slices shown]
[im 2/46]
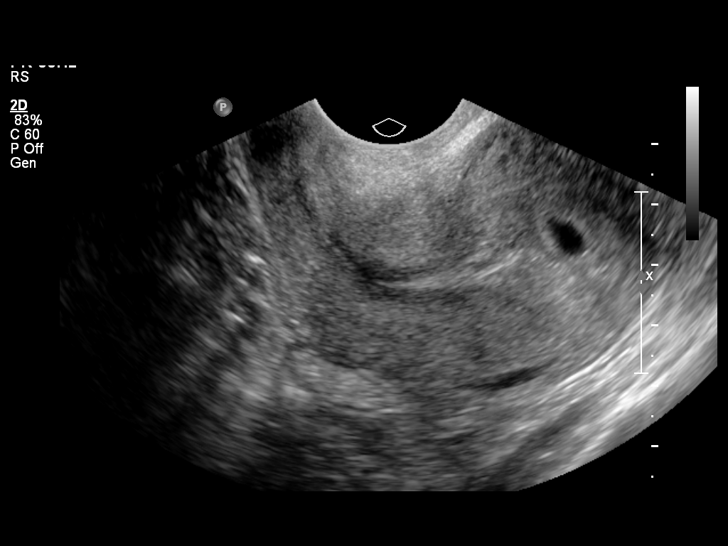
[im 6/46]
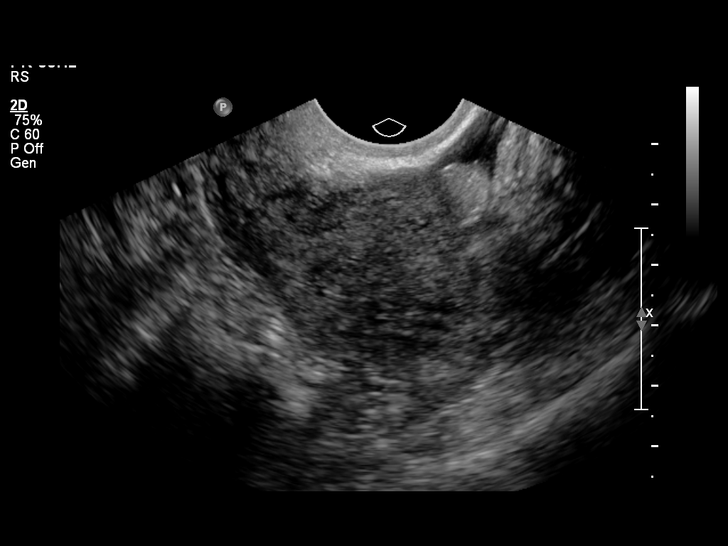
[im 9/46]
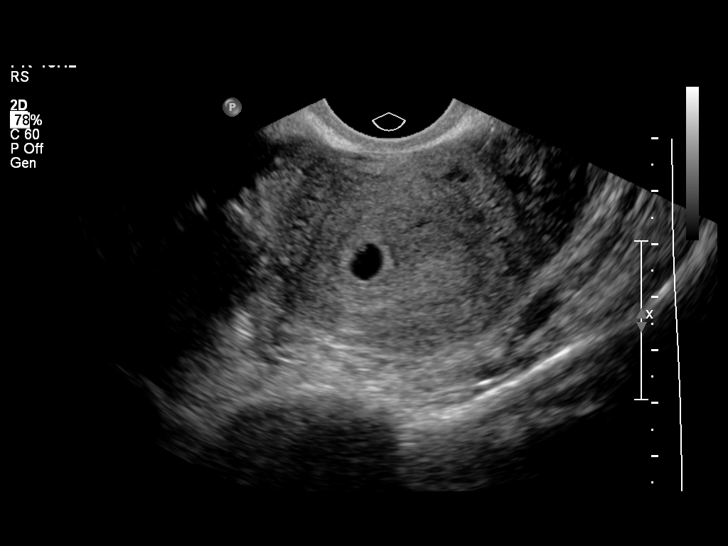
[im 12/46]
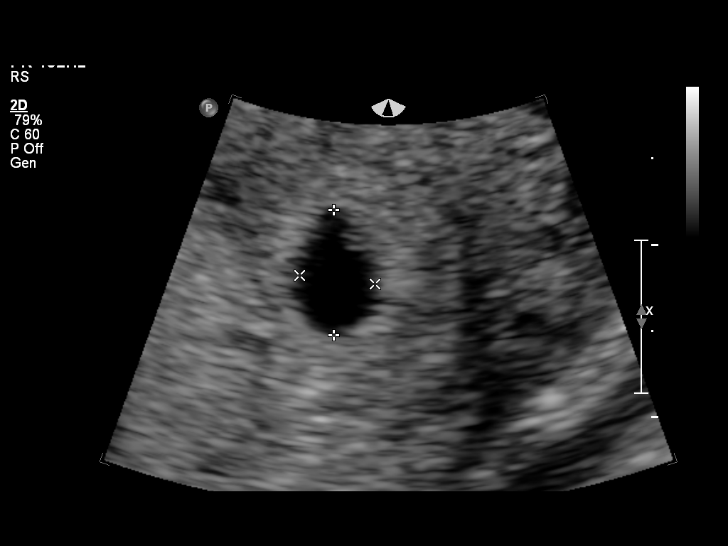
[im 16/46]
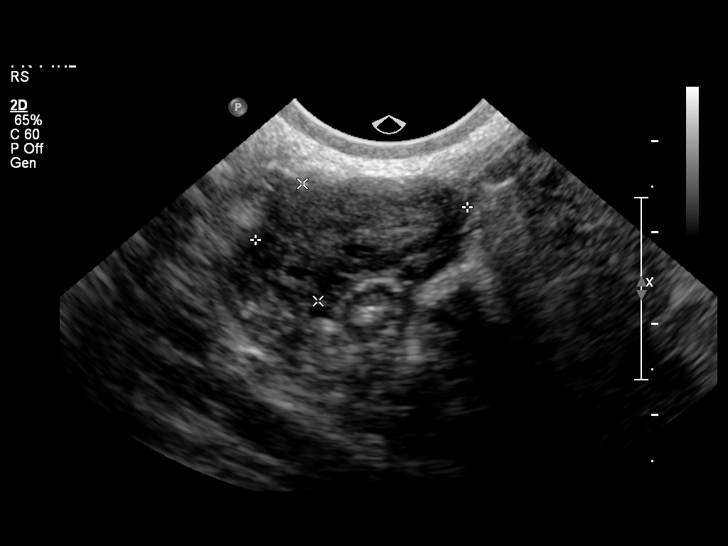
[im 19/46]
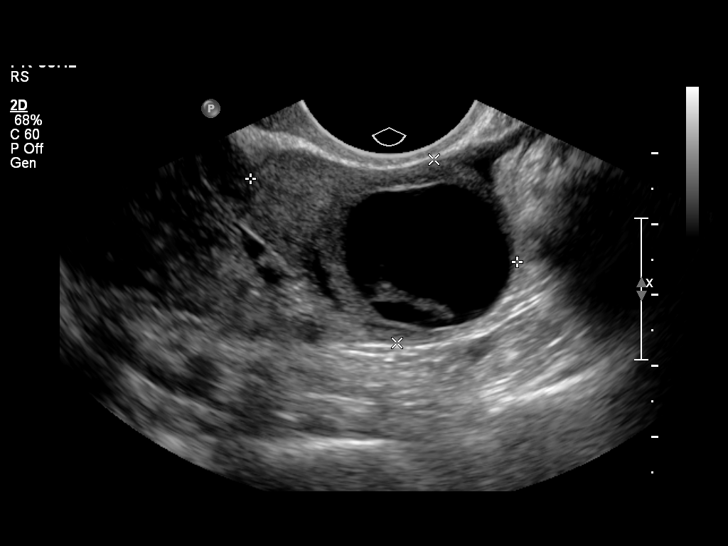
[im 22/46]
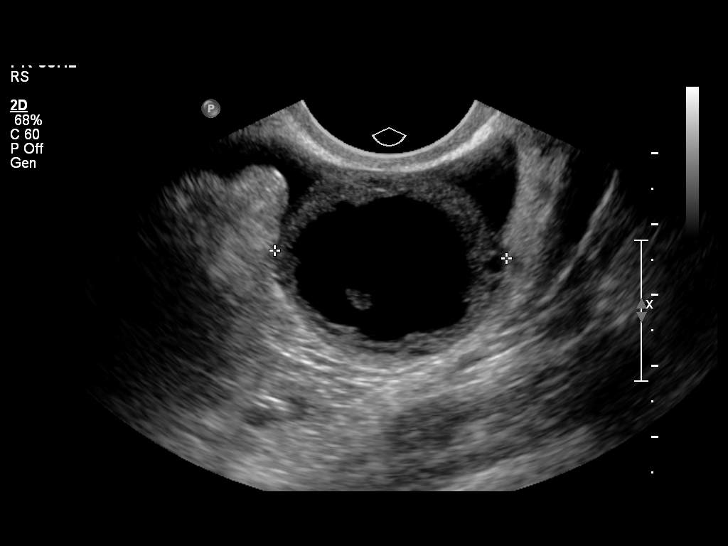
[im 26/46]
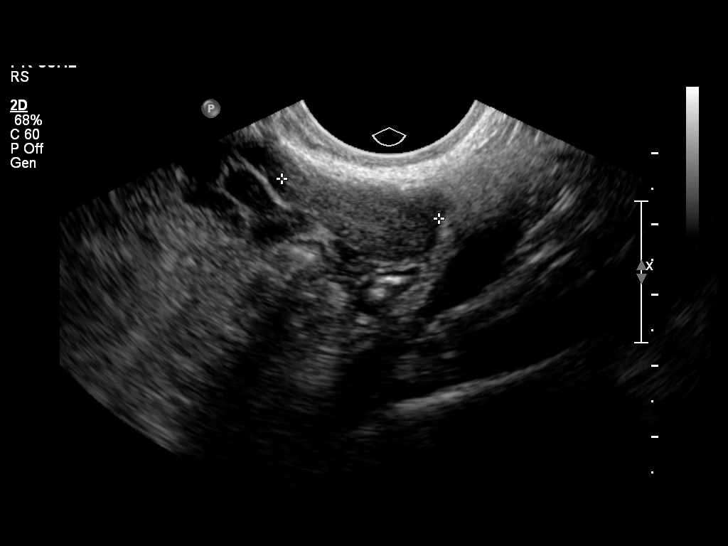
[im 29/46]
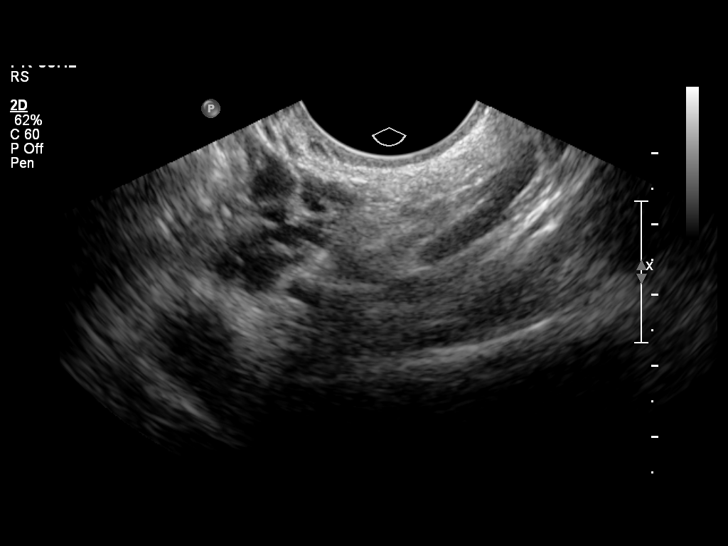
[im 32/46]
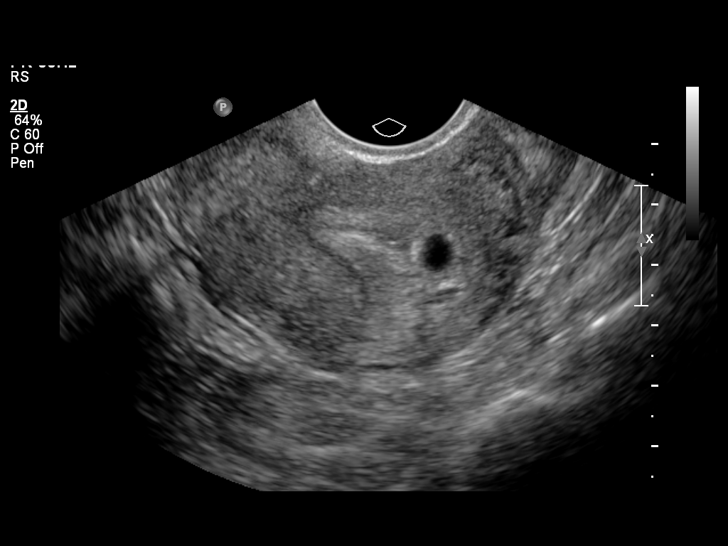
[im 36/46]
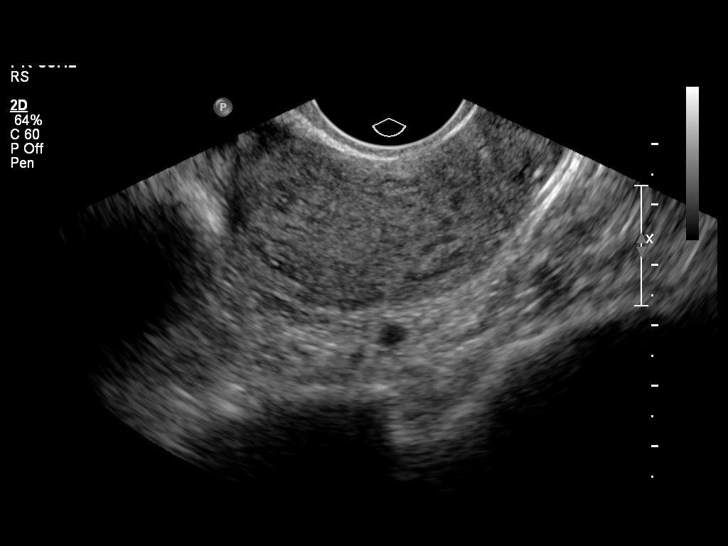
[im 39/46]
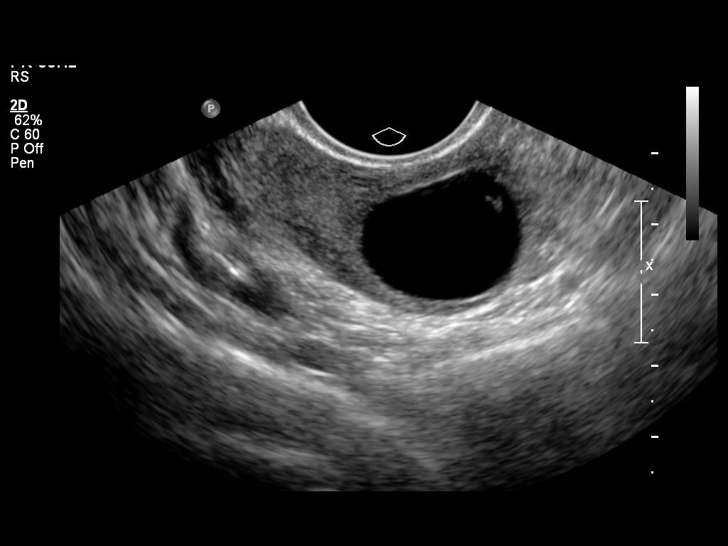
[im 42/46]
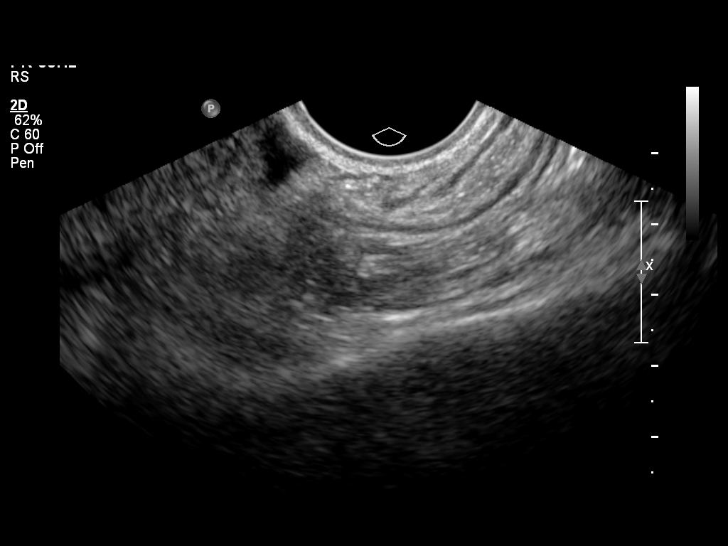
[im 46/46]
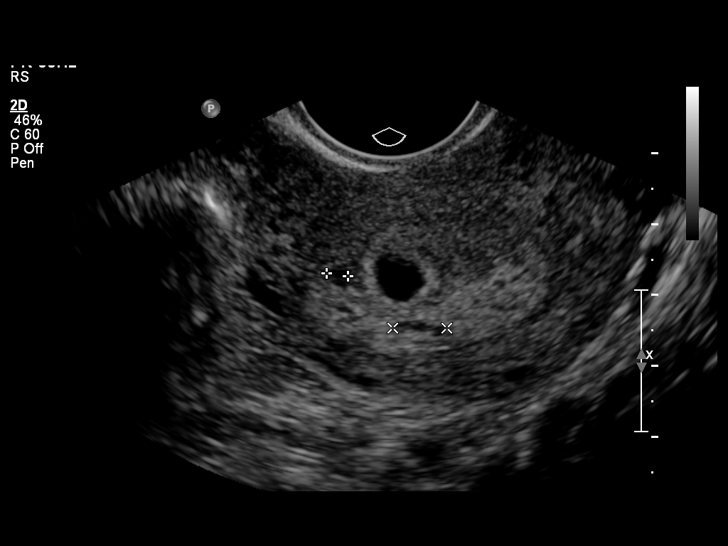

[14 of 28 positions shown; findings below may reference images not displayed]

FINDINGS: Intrauterine gestational sac: Visualized/normal in shape.

Yolk sac:  Not visualized

Embryo:  Not visualized

Cardiac Activity: Not visualized

MSD: 6  mm   5 w   1  d            US EDC: 08/24/13

Maternal uterus/adnexae: The ovaries are normal. Small pelvic free
fluid.
IMPRESSION: Intrauterine gestational sac but no yolk sac, fetal pole or cardiac
activity yet identified. To document appropriate pregnancy
progression and dating, followup ultrasound is recommended in 10-14
days.

## 2014-06-19 IMAGING — US US OB TRANSVAGINAL
1 series · 14 of 28 positions shown · non-contrast
Comparison: 12/23/2012

CLINICAL DATA: Pregnancy with inconclusive viability. Gestational
age by LMP of 7 weeks 2 days.

EXAM:
TRANSVAGINAL OB ULTRASOUND
TECHNIQUE: Transvaginal ultrasound was performed for complete evaluation of the
gestation as well as the maternal uterus, adnexal regions, and
pelvic cul-de-sac.

[Series 1: us ob transvaginal · 14 of 49 slices shown]
[im 2/49]
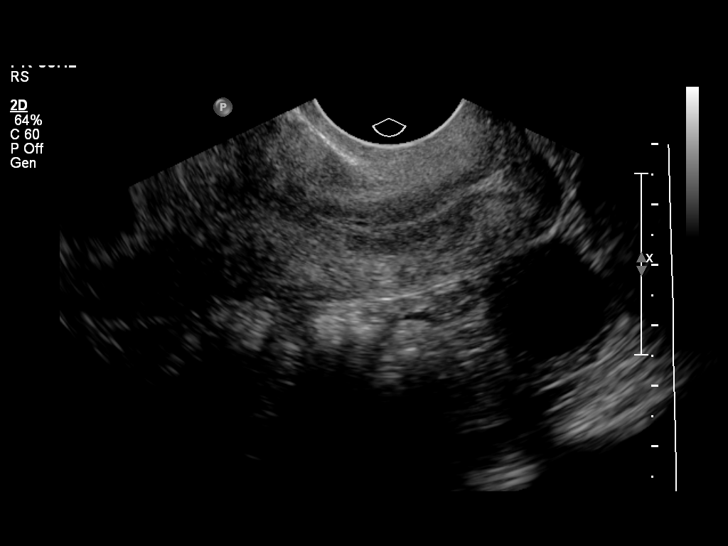
[im 6/49]
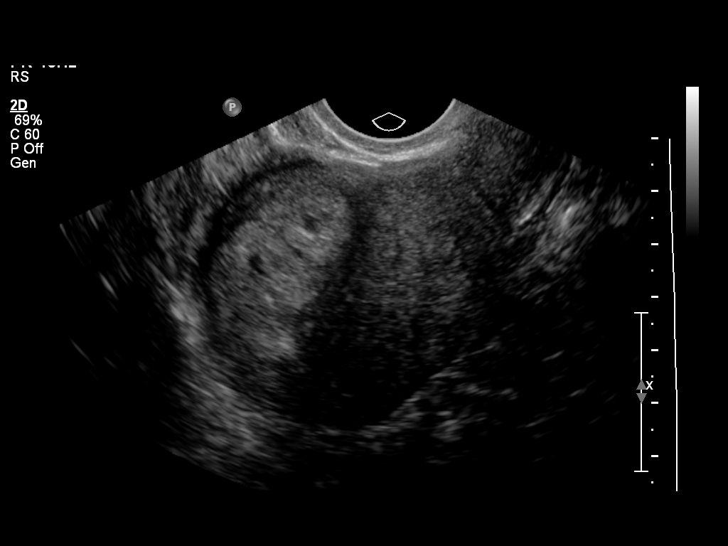
[im 9/49]
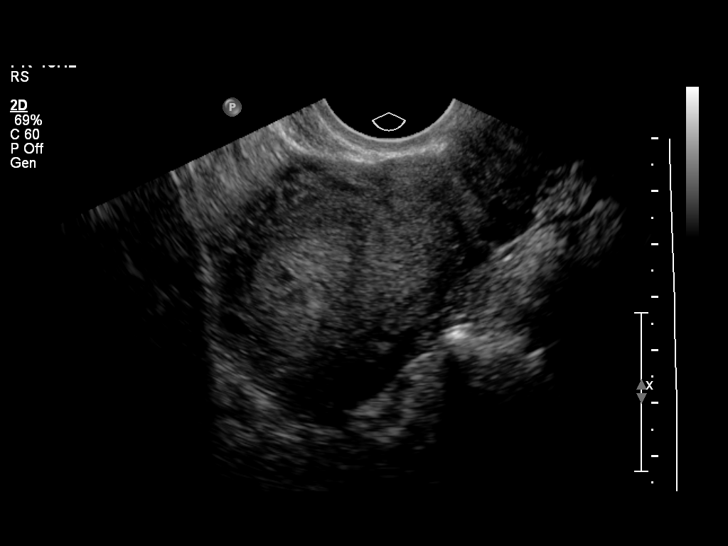
[im 13/49]
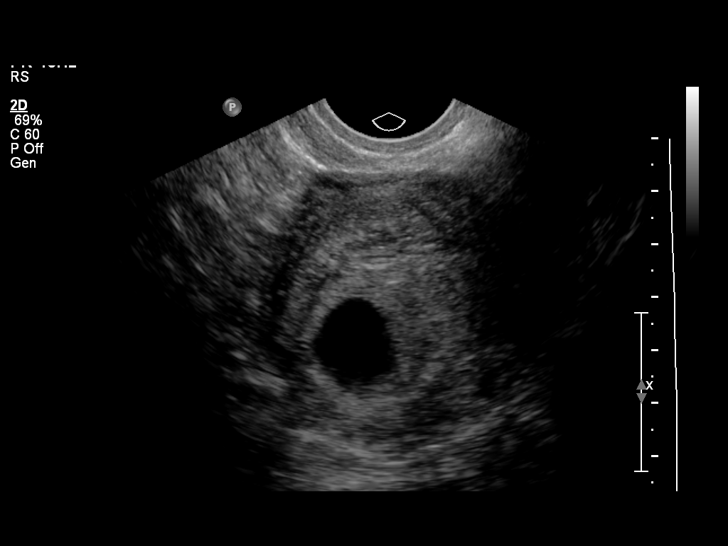
[im 17/49]
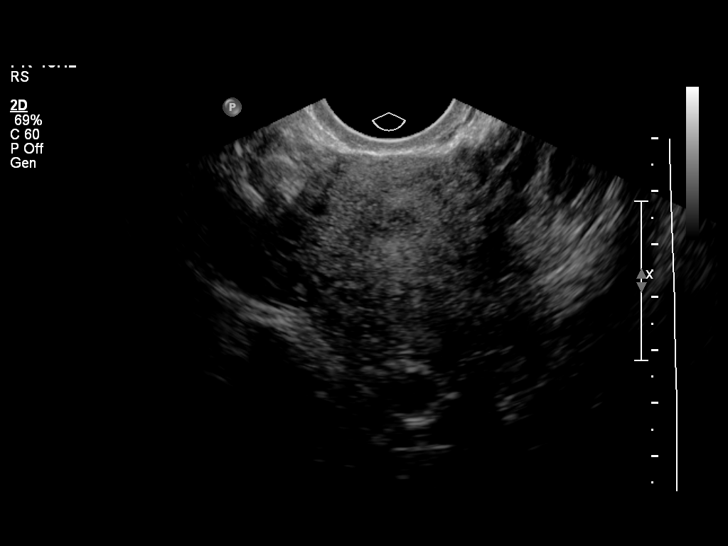
[im 20/49]
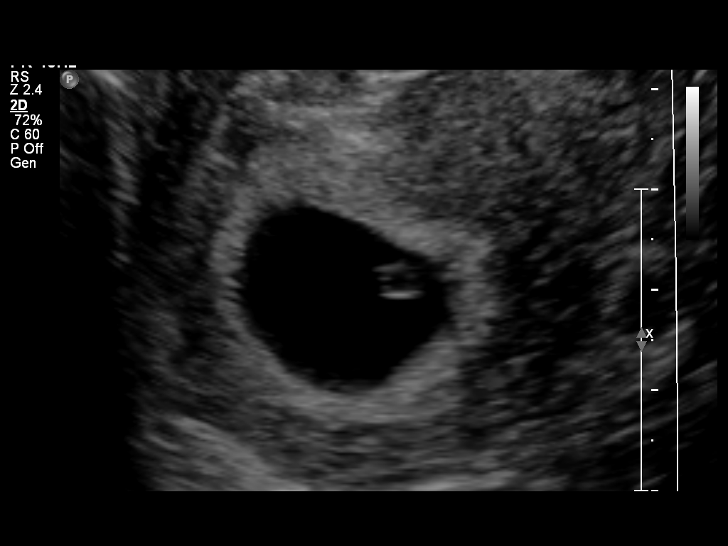
[im 24/49]
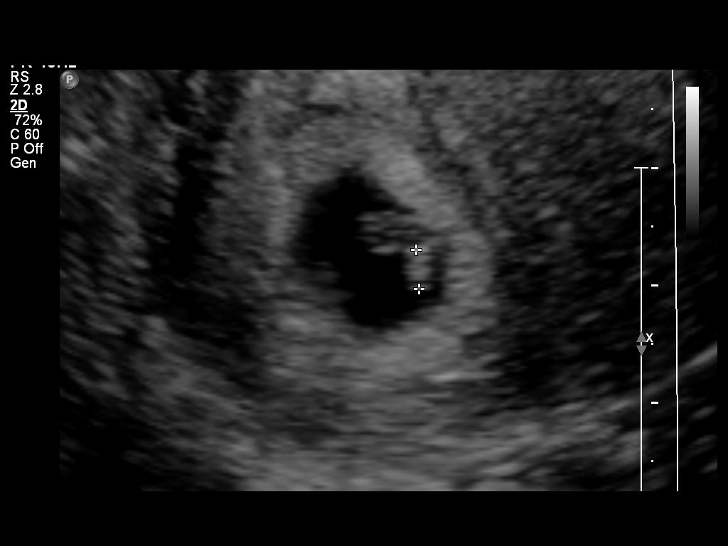
[im 27/49]
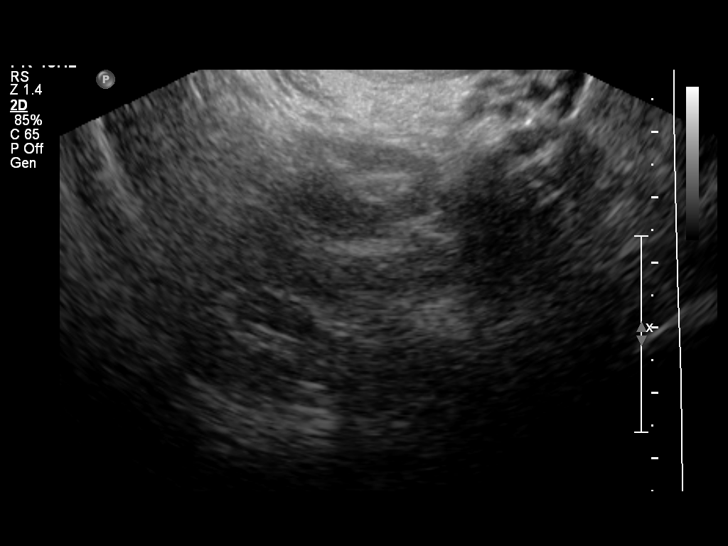
[im 31/49]
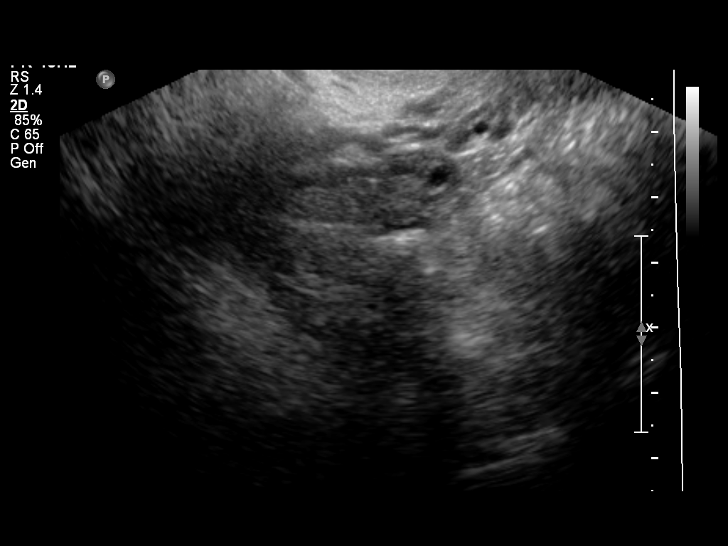
[im 34/49]
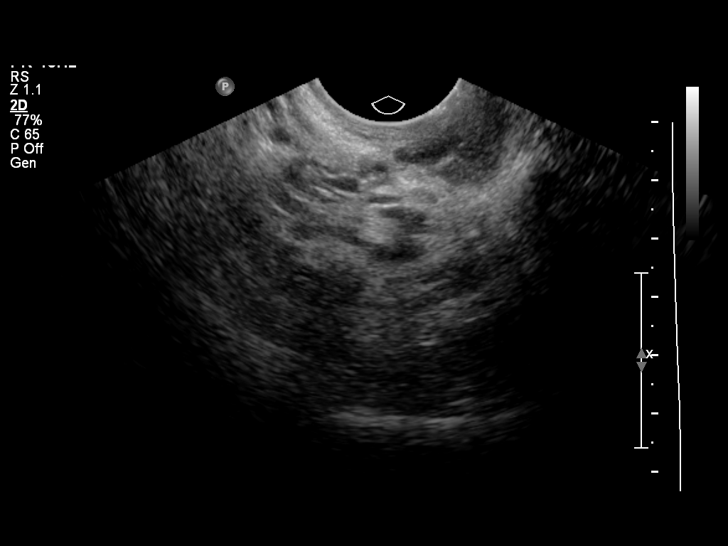
[im 38/49]
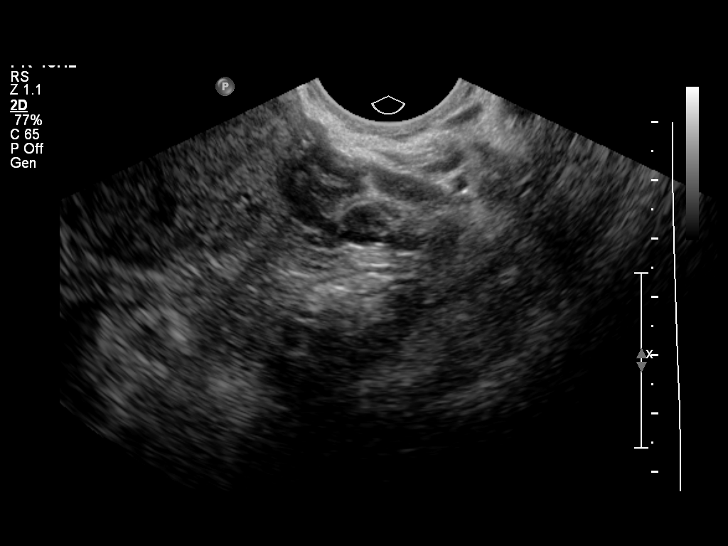
[im 41/49]
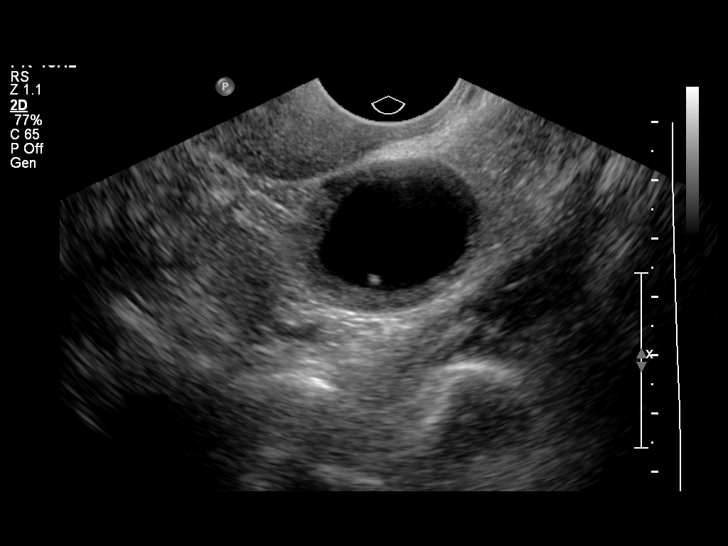
[im 45/49]
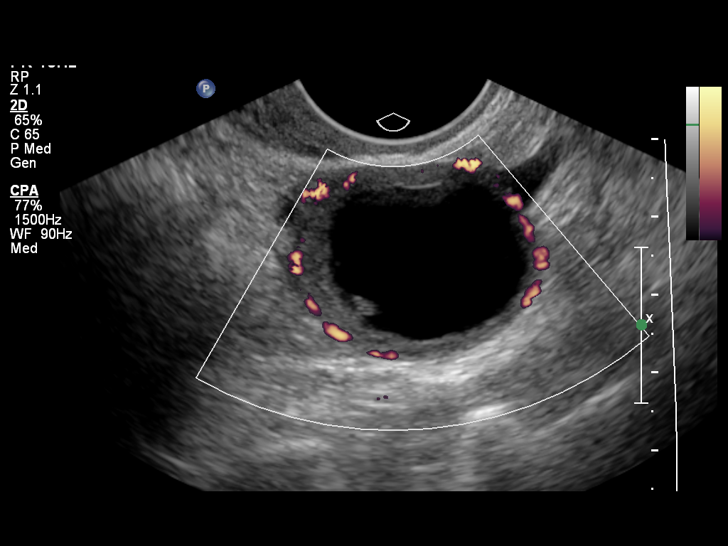
[im 49/49]
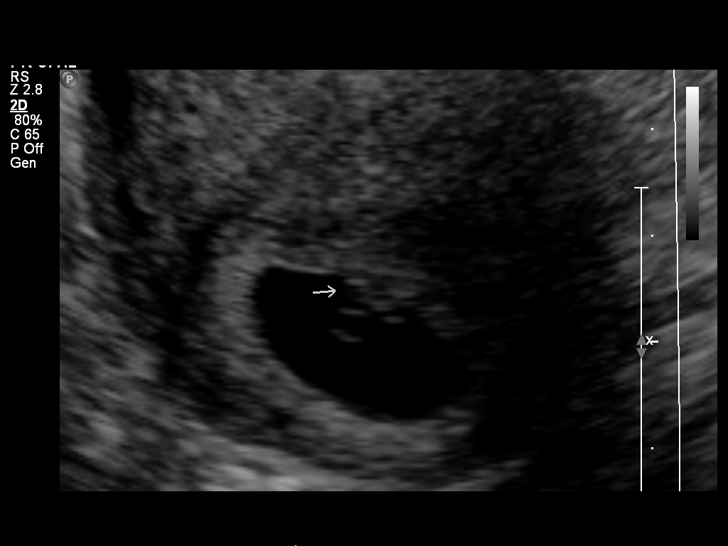

[14 of 28 positions shown; findings below may reference images not displayed]

FINDINGS: Intrauterine gestational sac: Visualized/normal in shape.

Yolk sac:  Visualized

Embryo:  Visualized

Cardiac Activity: Visualized

Heart Rate: 114 bpm

CRL:   4  mm   6 w 1 d                  US EDC: 08/27/2013

Maternal uterus/adnexae: Tiny subchorionic hemorrhage noted. Small
left ovarian corpus luteum cyst noted. Right ovary is normal in
appearance. No adnexal mass or free fluid identified.
IMPRESSION: Single living IUP measuring 6 weeks 1 day with US EDC of 08/27/2013.

Tiny subchorionic hemorrhage noted.

## 2015-08-27 ENCOUNTER — Encounter (HOSPITAL_COMMUNITY): Payer: Self-pay | Admitting: General Practice
